# Patient Record
Sex: Male | Born: 1955 | Race: White | Hispanic: No | Marital: Married | Smoking: Never smoker
Health system: Southern US, Community
[De-identification: ages and names within clinical notes are randomized; demographics above are authoritative.]

## PROBLEM LIST (undated history)

## (undated) DIAGNOSIS — R42 Dizziness and giddiness: Secondary | ICD-10-CM

## (undated) DIAGNOSIS — E785 Hyperlipidemia, unspecified: Secondary | ICD-10-CM

## (undated) DIAGNOSIS — R739 Hyperglycemia, unspecified: Secondary | ICD-10-CM

## (undated) DIAGNOSIS — K649 Unspecified hemorrhoids: Secondary | ICD-10-CM

## (undated) DIAGNOSIS — J329 Chronic sinusitis, unspecified: Secondary | ICD-10-CM

## (undated) DIAGNOSIS — F329 Major depressive disorder, single episode, unspecified: Secondary | ICD-10-CM

## (undated) DIAGNOSIS — S9306XA Dislocation of unspecified ankle joint, initial encounter: Secondary | ICD-10-CM

## (undated) DIAGNOSIS — F32A Depression, unspecified: Secondary | ICD-10-CM

## (undated) HISTORY — DX: Major depressive disorder, single episode, unspecified: F32.9

## (undated) HISTORY — DX: Hyperlipidemia, unspecified: E78.5

## (undated) HISTORY — DX: Chronic sinusitis, unspecified: J32.9

## (undated) HISTORY — DX: Unspecified hemorrhoids: K64.9

## (undated) HISTORY — DX: Depression, unspecified: F32.A

## (undated) HISTORY — DX: Dislocation of unspecified ankle joint, initial encounter: S93.06XA

## (undated) HISTORY — DX: Dizziness and giddiness: R42

## (undated) HISTORY — PX: HEMORRHOIDECTOMY WITH HEMORRHOID BANDING: SHX5633

## (undated) HISTORY — DX: Hyperglycemia, unspecified: R73.9

---

## 1982-02-08 HISTORY — PX: VASECTOMY: SHX75

## 2004-02-09 DIAGNOSIS — S9306XA Dislocation of unspecified ankle joint, initial encounter: Secondary | ICD-10-CM

## 2004-02-09 HISTORY — PX: OTHER SURGICAL HISTORY: SHX169

## 2004-02-09 HISTORY — DX: Dislocation of unspecified ankle joint, initial encounter: S93.06XA

## 2008-05-16 ENCOUNTER — Encounter: Admission: RE | Admit: 2008-05-16 | Discharge: 2008-05-16 | Payer: Self-pay | Admitting: Family Medicine

## 2008-07-01 HISTORY — PX: OTHER SURGICAL HISTORY: SHX169

## 2014-08-16 DIAGNOSIS — R079 Chest pain, unspecified: Secondary | ICD-10-CM | POA: Insufficient documentation

## 2014-08-16 DIAGNOSIS — Z8659 Personal history of other mental and behavioral disorders: Secondary | ICD-10-CM

## 2014-08-16 DIAGNOSIS — M25561 Pain in right knee: Secondary | ICD-10-CM

## 2014-08-16 DIAGNOSIS — R040 Epistaxis: Secondary | ICD-10-CM

## 2014-08-16 DIAGNOSIS — R030 Elevated blood-pressure reading, without diagnosis of hypertension: Secondary | ICD-10-CM

## 2014-08-16 DIAGNOSIS — Z125 Encounter for screening for malignant neoplasm of prostate: Secondary | ICD-10-CM

## 2014-08-16 DIAGNOSIS — Z113 Encounter for screening for infections with a predominantly sexual mode of transmission: Secondary | ICD-10-CM

## 2014-08-16 DIAGNOSIS — H6980 Other specified disorders of Eustachian tube, unspecified ear: Secondary | ICD-10-CM | POA: Insufficient documentation

## 2014-08-16 DIAGNOSIS — D497 Neoplasm of unspecified behavior of endocrine glands and other parts of nervous system: Secondary | ICD-10-CM

## 2014-08-16 DIAGNOSIS — J329 Chronic sinusitis, unspecified: Secondary | ICD-10-CM

## 2014-08-16 DIAGNOSIS — H6990 Unspecified Eustachian tube disorder, unspecified ear: Secondary | ICD-10-CM

## 2014-08-16 DIAGNOSIS — K921 Melena: Secondary | ICD-10-CM

## 2014-08-16 DIAGNOSIS — R740 Nonspecific elevation of levels of transaminase and lactic acid dehydrogenase [LDH]: Secondary | ICD-10-CM

## 2014-08-16 DIAGNOSIS — R7401 Elevation of levels of liver transaminase levels: Secondary | ICD-10-CM | POA: Insufficient documentation

## 2014-08-16 DIAGNOSIS — R361 Hematospermia: Secondary | ICD-10-CM

## 2014-08-16 DIAGNOSIS — R7402 Elevation of levels of lactic acid dehydrogenase (LDH): Secondary | ICD-10-CM

## 2014-08-16 DIAGNOSIS — A5139 Other secondary syphilis of skin: Secondary | ICD-10-CM

## 2014-08-16 DIAGNOSIS — R739 Hyperglycemia, unspecified: Secondary | ICD-10-CM

## 2014-08-19 ENCOUNTER — Encounter: Payer: Self-pay | Admitting: Nurse Practitioner

## 2014-08-19 ENCOUNTER — Ambulatory Visit (INDEPENDENT_AMBULATORY_CARE_PROVIDER_SITE_OTHER): Payer: BLUE CROSS/BLUE SHIELD | Admitting: Nurse Practitioner

## 2014-08-19 VITALS — BP 142/82 | HR 75 | Ht 72.0 in | Wt 209.2 lb

## 2014-08-19 DIAGNOSIS — R079 Chest pain, unspecified: Secondary | ICD-10-CM

## 2014-08-19 DIAGNOSIS — R0602 Shortness of breath: Secondary | ICD-10-CM

## 2014-08-19 DIAGNOSIS — E785 Hyperlipidemia, unspecified: Secondary | ICD-10-CM

## 2014-08-19 MED ORDER — ASPIRIN EC 81 MG PO TBEC: 81.0000 mg | DELAYED_RELEASE_TABLET | Freq: Every day | ORAL | Status: DC

## 2014-08-19 NOTE — Patient Instructions (Addendum)
We will be checking the following labs today - NONE   Medication Instructions:    Continue with your current medicines  Add aspirin 81 mg a day    Testing/Procedures To Be Arranged:  Stress echocardiogram  Exercise Stress Echocardiogram An exercise stress echocardiogram is a heart (cardiac) test used to check the function of your heart. This test may also be called an exercise stress echocardiography or stress echo. This stress test will check how well your heart muscle and valves are working and determine if your heart muscle is getting enough blood. You will exercise on a treadmill to naturally increase or stress the functioning of your heart.  An echocardiogram uses sound waves (ultrasound) to produce an image of your heart. If your heart does not work normally, it may indicate coronary artery disease with poor coronary blood supply. The coronary arteries are the arteries that bring blood and oxygen to your heart. LET [facility] CARE PROVIDER KNOW ABOUT:  Any allergies you have.  All medicines you are taking, including vitamins, herbs, eye drops, creams, and over-the-counter medicines.  Previous problems you or members of your family have had with the use of anesthetics.  Any blood disorders you have.  Previous surgeries you have had.  Medical conditions you have.  Possibility of pregnancy, if this applies. RISKS AND COMPLICATIONS Generally, this is a safe procedure. However, as with any procedure, complications can occur. Possible complications can include:  You develop pain or pressure in the following areas:  Chest.  Jaw or neck.  Between your shoulder blades.  Radiating down your left arm.  Dizziness or lightheadedness.  Shortness of breath.  Increased or irregular heartbeat.  Nausea or vomiting.  Heart attack (rare). BEFORE THE PROCEDURE  Avoid all forms of caffeine for 24 hours before your test or as directed by your health care provider. This  includes coffee, tea (even decaffeinated tea), caffeinated sodas, chocolate, cocoa, and certain pain medicines.  Follow your health care provider's instructions regarding eating and drinking before the test.  Take your medicines as directed at regular times with water unless instructed otherwise. Exceptions may include:  If you have diabetes, ask how you are to take your insulin or pills. It is common to adjust insulin dosing the morning of the test.  If you are taking beta-blocker medicines, it is important to talk to your health care provider about these medicines well before the date of your test. Taking beta-blocker medicines may interfere with the test. In some cases, these medicines need to be changed or stopped 24 hours or more before the test.  If you wear a nitroglycerin patch, it may need to be removed prior to the test. Ask your health care provider if the patch should be removed before the test.  If you use an inhaler for any breathing condition, bring it with you to the test.  If you are an outpatient, bring a snack so you can eat right after the stress phase of the test.  Do not smoke for 4 hours prior to the test or as directed by your health care provider.  Wear loose-fitting clothes and comfortable shoes for the test. This test involves walking on a treadmill. PROCEDURE   Multiple electrodes will be put on your chest. If needed, small areas of your chest may be shaved to get better contact with the electrodes. Once the electrodes are attached to your body, multiple wires will be attached to the electrodes, and your heart rate will be  monitored.  You will have an echocardiogram done at rest.  To produce this image of your heart, gel is applied to your chest, and a wand-like tool (transducer) is moved over the chest. The transducer sends the sound waves through the chest to create the moving images of your heart.  You may need an IV to receive a medication that improves the  quality of the pictures.  You will then walk on a treadmill. The treadmill will be started at a slow pace. The treadmill speed and incline will gradually be increased to raise your heart rate.  At the peak of exercise, the treadmill will be stopped. You will lie down immediately on a bed so that a second echocardiogram can be done to visualize your heart's motion with exercise.  The test usually takes 30-60 minutes to complete. AFTER THE PROCEDURE  Your heart rate and blood pressure will be monitored after the test.  You may return to your normal schedule, including diet, activities, and medicines, unless your health care provider tells you otherwise.   Follow-Up:   Will see back if stress test is abnormal or if has further symptoms    Other Special Instructions:   N/A  Call the [place] office at [phone] if you have any questions, problems or concerns.

## 2014-08-19 NOTE — Progress Notes (Signed)
CARDIOLOGY OFFICE NOTE  Date:  08/19/2014    Jason Crane Date of Birth: 1955-02-11 Medical Record #[id number]  PCP:  No primary care provider on file.  Cardiologist:  New  - discussed with Dr. Candee Crane  Chief Complaint  Patient presents with  . Chest Pain    New patient visit.     History of Present Illness: Jason Crane is a 59 y.o. male who presents today for a new patient visit. He has never been seen by cardiology. He is the father of Jason Handler, PA with TCTS. He has no known CAD but has HLD - on Zocor, sinusitis, prior elevated BP, hyperglycemia, & depression. Reports a negative stress test when he was 41 while in [place]. He is a nonsmoker. FH + for early CAD with his father having 1st MI at age 20 - smoker, diabetic and he passed at age 63. 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   He was last seen by his PCP in December - notes reviewed.   Comes in today. Here alone. Here mostly at the urging of Jason Crane. He notes that he has an easier tendency to sweat. More easily dehydrated. BP has been borderline along with his glucose - he has been actively losing weight. Walks several times a week and golfs. No chest pain. Not short of breath. No indigestion, tightness, heaviness. 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 No frank syncope. Does not take aspirin regularly.   Past Medical History  Diagnosis Date  . HLD (hyperlipidemia)   . Sinusitis   . Depression   . Hemorrhoid   . Ankle dislocation 2006  . Hyperglycemia   . Vertigo     Past Surgical History  Procedure Laterality Date  . Colonscopy  5.24.2010  . Hemorrhoidectomy with hemorrhoid banding    . Vasectomy  1984  . Left ankle surgery  2006     Medications: Current Outpatient Prescriptions  Medication Sig Dispense Refill  . Multiple Vitamin (MULTIVITAMIN) capsule Take 1 capsule by mouth daily.    . simvastatin (ZOCOR) 40 MG tablet Take 40 mg by mouth daily.    Marland Kitchen aspirin EC 81 MG tablet Take 1  tablet (81 mg total) by mouth daily. 90 tablet 3  . fluticasone (VERAMYST) 27.5 MCG/SPRAY nasal spray Place 2 sprays into the nose daily.     No current facility-administered medications for this visit.    Allergies: No Known Allergies  Social History: The patient  reports that he has never smoked. He does not have any smokeless tobacco history on file. He reports that he drinks alcohol. He reports that he does not use illicit drugs.   Family History: The patient's family history includes Alzheimer's disease in his maternal grandmother; Diabetes in his father and paternal grandmother; Heart disease (age of onset: 51) in his father; Hypertension in his father; Pancreatic cancer (age of onset: 53) in his mother.   Review of Systems: Please see the history of present illness.   Otherwise, the review of systems is positive for none.   All other systems are reviewed and negative.   Physical Exam: VS:  BP 142/82 mmHg  Pulse 75  Ht 6' (1.829 m)  Wt 209 lb 3.2 oz (94.892 kg)  BMI 28.37 kg/m2  SpO2 97% .  BMI Body mass index is 28.37 kg/(m^2).  Wt Readings from Last 3 Encounters:  08/19/14 209 lb 3.2 oz (94.892 kg)   BP by me is down to 120/85.  General: Pleasant. Well developed, well nourished and in no acute distress.  HEENT: Normal. Neck: Supple, no JVD, carotid bruits, or masses noted.  Cardiac: Regular rate and rhythm. No murmurs, rubs, or gallops. No edema.  Respiratory:  Lungs are clear to auscultation bilaterally with normal work of breathing.  GI: Soft and nontender.  MS: No deformity or atrophy. Gait and ROM intact. Skin: Warm and dry. Color is normal.  Neuro:  Strength and sensation are intact and no gross focal deficits noted.  Psych: Alert, appropriate and with normal affect.   LABORATORY DATA:  EKG:  EKG is ordered today. This demonstrates NSR. He has inferior Q's. He has poor R wave progression - EKG reviewed with Dr. Marlou Crane here in the office.  No results found  for: WBC, HGB, HCT, PLT, GLUCOSE, CHOL, TRIG, HDL, LDLDIRECT, LDLCALC, ALT, AST, NA, K, CL, CREATININE, BUN, CO2, TSH, PSA, INR, GLUF, HGBA1C, MICROALBUR  BNP (last 3 results) No results for input(s): BNP in the last 8760 hours.  ProBNP (last 3 results) No results for input(s): PROBNP in the last 8760 hours.   Other Studies Reviewed Today: Outside records.   Assessment/Plan: 1. Abnormal EKG - no real symptoms reported. EKG from 2011 is abnormal as well.  Negative chest pain, shortness of breath, syncope. Exam is ok. Discussed with Dr. Marlou Crane here in the office - will arrange for stress echocardiogram. Recommending aspirin 81 mg daily as well. Further disposition to follow.   2. HLD - on statin - his last panel showed a TC of 154, TG 105, HLD 36 and LDL 97  3. Borderline HTN - recheck by me is ok. He is actively working on his weight/diet. Would continue to follow.  4. Postural dizziness - without frank syncope.   5. Easily diaphoretic - not sure what to make of that. No other associated symptoms.   Current medicines are reviewed with the patient today.  The patient does not have concerns regarding medicines other than what has been noted above.  The following changes have been made:  See above.  Labs/ tests ordered today include:    Orders Placed This Encounter  Procedures  . EKG 12-Lead  . ECHO STRESS TEST     Disposition:   Further disposition to follow.   Patient is agreeable to this plan and will call if any problems develop in the interim.   Signed: Burtis Junes, RN, ANP-C 08/19/2014 1:59 PM  [facility] [address] [place], [place]  [address] Phone: [phone] Fax: [phone]

## 2014-08-21 ENCOUNTER — Telehealth (HOSPITAL_COMMUNITY): Payer: Self-pay | Admitting: *Deleted

## 2014-08-21 NOTE — Telephone Encounter (Signed)
Left message on voicemail in reference to upcoming appointment scheduled for 08/26/14. Phone number given for a call back so details instructions can be given. Hubbard Robinson, RN

## 2014-08-22 ENCOUNTER — Telehealth (HOSPITAL_COMMUNITY): Payer: Self-pay | Admitting: *Deleted

## 2014-08-22 NOTE — Telephone Encounter (Signed)
Left message on voicemail in reference to upcoming appointment scheduled for 08/23/14. Phone number given for a call back so details instructions can be given. Consuello Lassalle J Auburn Hester, RN 

## 2014-08-26 ENCOUNTER — Ambulatory Visit (HOSPITAL_COMMUNITY): Payer: BLUE CROSS/BLUE SHIELD | Attending: Cardiology

## 2014-08-26 ENCOUNTER — Ambulatory Visit (HOSPITAL_BASED_OUTPATIENT_CLINIC_OR_DEPARTMENT_OTHER): Payer: BLUE CROSS/BLUE SHIELD

## 2014-08-26 DIAGNOSIS — R0602 Shortness of breath: Secondary | ICD-10-CM

## 2014-08-26 DIAGNOSIS — R079 Chest pain, unspecified: Secondary | ICD-10-CM | POA: Diagnosis not present

## 2014-08-26 DIAGNOSIS — E785 Hyperlipidemia, unspecified: Secondary | ICD-10-CM

## 2014-08-26 DIAGNOSIS — Z8249 Family history of ischemic heart disease and other diseases of the circulatory system: Secondary | ICD-10-CM | POA: Insufficient documentation

## 2014-11-21 ENCOUNTER — Encounter: Payer: Self-pay | Admitting: Gastroenterology

## 2015-01-21 ENCOUNTER — Ambulatory Visit: Payer: BLUE CROSS/BLUE SHIELD | Admitting: Gastroenterology

## 2015-03-22 ENCOUNTER — Emergency Department (HOSPITAL_COMMUNITY)
Admission: EM | Admit: 2015-03-22 | Discharge: 2015-03-22 | Disposition: A | Discharge status: Home / Self Care | Payer: BLUE CROSS/BLUE SHIELD | Attending: Emergency Medicine | Admitting: Emergency Medicine

## 2015-03-22 ENCOUNTER — Emergency Department (HOSPITAL_COMMUNITY): Payer: BLUE CROSS/BLUE SHIELD

## 2015-03-22 ENCOUNTER — Encounter (HOSPITAL_COMMUNITY): Payer: Self-pay | Admitting: Emergency Medicine

## 2015-03-22 ENCOUNTER — Emergency Department
Admission: EM | Admit: 2015-03-22 | Discharge: 2015-03-22 | Disposition: A | Discharge status: Home / Self Care | Payer: BLUE CROSS/BLUE SHIELD | Attending: Emergency Medicine | Admitting: Emergency Medicine

## 2015-03-22 ENCOUNTER — Encounter: Payer: Self-pay | Admitting: Emergency Medicine

## 2015-03-22 DIAGNOSIS — S61411A Laceration without foreign body of right hand, initial encounter: Secondary | ICD-10-CM | POA: Insufficient documentation

## 2015-03-22 DIAGNOSIS — Y9389 Activity, other specified: Secondary | ICD-10-CM | POA: Diagnosis not present

## 2015-03-22 DIAGNOSIS — W312XXA Contact with powered woodworking and forming machines, initial encounter: Secondary | ICD-10-CM | POA: Insufficient documentation

## 2015-03-22 DIAGNOSIS — S61412A Laceration without foreign body of left hand, initial encounter: Secondary | ICD-10-CM | POA: Diagnosis not present

## 2015-03-22 DIAGNOSIS — Z7951 Long term (current) use of inhaled steroids: Secondary | ICD-10-CM | POA: Insufficient documentation

## 2015-03-22 DIAGNOSIS — Z7982 Long term (current) use of aspirin: Secondary | ICD-10-CM | POA: Insufficient documentation

## 2015-03-22 DIAGNOSIS — E785 Hyperlipidemia, unspecified: Secondary | ICD-10-CM | POA: Insufficient documentation

## 2015-03-22 DIAGNOSIS — Y998 Other external cause status: Secondary | ICD-10-CM | POA: Diagnosis not present

## 2015-03-22 DIAGNOSIS — Y9289 Other specified places as the place of occurrence of the external cause: Secondary | ICD-10-CM | POA: Insufficient documentation

## 2015-03-22 DIAGNOSIS — Z79899 Other long term (current) drug therapy: Secondary | ICD-10-CM | POA: Diagnosis not present

## 2015-03-22 DIAGNOSIS — F329 Major depressive disorder, single episode, unspecified: Secondary | ICD-10-CM | POA: Insufficient documentation

## 2015-03-22 MED ORDER — CEPHALEXIN 500 MG PO CAPS: 500.0000 mg | ORAL_CAPSULE | Freq: Four times a day (QID) | ORAL | Status: DC

## 2015-03-22 NOTE — ED Notes (Signed)
Pt to nurse's station upset with wait time. Explained to patient that our providers are seeing multiple patients at this time. Pt stated "this is ridiculous, this is not a scratch" pt states he going to [facility]. Also made the comment that he better not get billed for this.
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
## 2015-03-22 NOTE — ED Notes (Signed)
The pt has minimal bleeding and his hand is not hurting at present
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
## 2015-03-22 NOTE — Discharge Instructions (Signed)
Mr. Jason Crane,  Nice meeting you! Please follow-up with hand. Return to the emergency department if you develop increased pain, yellow/green drainage, red streaking from the wound. Feel better soon!  S. Wendie Simmer, PA-C

## 2015-03-22 NOTE — ED Notes (Addendum)
Pt presents with deep laceration to palm of right hand. Bleeding controlled with pressure. Bleeding resumes when dressing applied. Pt has movement of fingers. Pt states has feeling in all fingers, sensation in right fifth finger.

## 2015-03-22 NOTE — ED Notes (Signed)
Cut hand with power saw just prior to arrival, cut to outer L hand with bleeding controlled with bandage.

## 2015-03-22 NOTE — ED Notes (Signed)
Patient her with laceration to significant left hand laceration. Was operating a circular saw tonight without the safety guard. Saw caught the outer edge of left hand. Bleeding currently controlled with dressing. Injury is isolated to palm. All finger motion intact, no paresthesia.

## 2017-04-27 ENCOUNTER — Encounter (HOSPITAL_COMMUNITY): Payer: Self-pay

## 2017-04-27 ENCOUNTER — Emergency Department (HOSPITAL_COMMUNITY)
Admission: EM | Admit: 2017-04-27 | Discharge: 2017-04-27 | Disposition: A | Discharge status: Home / Self Care | Payer: BLUE CROSS/BLUE SHIELD | Attending: Emergency Medicine | Admitting: Emergency Medicine

## 2017-04-27 ENCOUNTER — Emergency Department (HOSPITAL_COMMUNITY): Payer: BLUE CROSS/BLUE SHIELD

## 2017-04-27 ENCOUNTER — Other Ambulatory Visit: Payer: Self-pay

## 2017-04-27 DIAGNOSIS — M545 Low back pain: Secondary | ICD-10-CM | POA: Insufficient documentation

## 2017-04-27 DIAGNOSIS — Z79899 Other long term (current) drug therapy: Secondary | ICD-10-CM | POA: Insufficient documentation

## 2017-04-27 DIAGNOSIS — Z8639 Personal history of other endocrine, nutritional and metabolic disease: Secondary | ICD-10-CM | POA: Diagnosis not present

## 2017-04-27 DIAGNOSIS — Z7982 Long term (current) use of aspirin: Secondary | ICD-10-CM | POA: Diagnosis not present

## 2017-04-27 DIAGNOSIS — N200 Calculus of kidney: Secondary | ICD-10-CM

## 2017-04-27 DIAGNOSIS — R109 Unspecified abdominal pain: Secondary | ICD-10-CM | POA: Diagnosis present

## 2017-04-27 LAB — URINALYSIS, ROUTINE W REFLEX MICROSCOPIC
Bacteria, UA: NONE SEEN
Bilirubin Urine: NEGATIVE
Glucose, UA: NEGATIVE mg/dL
Ketones, ur: NEGATIVE mg/dL
Leukocytes, UA: NEGATIVE
Nitrite: NEGATIVE
Protein, ur: NEGATIVE mg/dL
Specific Gravity, Urine: 1.016 (ref 1.005–1.030)
Squamous Epithelial / LPF: NONE SEEN
pH: 5 (ref 5.0–8.0)

## 2017-04-27 LAB — CBC WITH DIFFERENTIAL/PLATELET
Basophils Absolute: 0 10*3/uL (ref 0.0–0.1)
Basophils Relative: 1 %
Eosinophils Absolute: 0.2 10*3/uL (ref 0.0–0.7)
Eosinophils Relative: 3 %
HCT: 40.9 % (ref 39.0–52.0)
Hemoglobin: 13.6 g/dL (ref 13.0–17.0)
Lymphocytes Relative: 27 %
Lymphs Abs: 1.4 10*3/uL (ref 0.7–4.0)
MCH: 30.2 pg (ref 26.0–34.0)
MCHC: 33.3 g/dL (ref 30.0–36.0)
MCV: 90.7 fL (ref 78.0–100.0)
Monocytes Absolute: 0.4 10*3/uL (ref 0.1–1.0)
Monocytes Relative: 8 %
Neutro Abs: 3.3 10*3/uL (ref 1.7–7.7)
Neutrophils Relative %: 61 %
Platelets: 206 10*3/uL (ref 150–400)
RBC: 4.51 MIL/uL (ref 4.22–5.81)
RDW: 13 % (ref 11.5–15.5)
WBC: 5.4 10*3/uL (ref 4.0–10.5)

## 2017-04-27 LAB — BASIC METABOLIC PANEL
Anion gap: 9 (ref 5–15)
BUN: 15 mg/dL (ref 6–20)
CO2: 25 mmol/L (ref 22–32)
Calcium: 8.5 mg/dL — ABNORMAL LOW (ref 8.9–10.3)
Chloride: 102 mmol/L (ref 101–111)
Creatinine, Ser: 0.91 mg/dL (ref 0.61–1.24)
GFR calc Af Amer: >60 mL/min (ref >60)
GFR calc non Af Amer: >60 mL/min (ref >60)
Glucose, Bld: 146 mg/dL — ABNORMAL HIGH (ref 65–99)
Potassium: 3.8 mmol/L (ref 3.5–5.1)
Sodium: 136 mmol/L (ref 135–145)

## 2017-04-27 MED ORDER — TAMSULOSIN HCL 0.4 MG PO CAPS: 0.4000 mg | ORAL_CAPSULE | Freq: Every day | ORAL | 0 refills | Status: DC

## 2017-04-27 MED ORDER — KETOROLAC TROMETHAMINE 10 MG PO TABS: 10.0000 mg | ORAL_TABLET | Freq: Four times a day (QID) | ORAL | 0 refills | Status: DC | PRN

## 2017-04-27 MED ORDER — OXYCODONE-ACETAMINOPHEN 5-325 MG PO TABS
1.0000 | ORAL_TABLET | ORAL | Status: DC | PRN
  Administered 2017-04-27: 1 via ORAL
  Filled 2017-04-27 (×2): qty 1

## 2017-04-27 MED ORDER — OXYCODONE-ACETAMINOPHEN 5-325 MG PO TABS: 1.0000 | ORAL_TABLET | Freq: Four times a day (QID) | ORAL | 0 refills | Status: DC | PRN

## 2017-04-27 MED ORDER — HYDROMORPHONE HCL 1 MG/ML IJ SOLN
0.5000 mg | Freq: Once | INTRAMUSCULAR | Status: AC
Start: 2017-04-27 — End: 2017-04-27
  Administered 2017-04-27: 0.5 mg via INTRAVENOUS
  Filled 2017-04-27: qty 1

## 2017-04-27 MED ORDER — KETOROLAC TROMETHAMINE 30 MG/ML IJ SOLN
30.0000 mg | Freq: Once | INTRAMUSCULAR | Status: AC
  Administered 2017-04-27: 30 mg via INTRAMUSCULAR
  Filled 2017-04-27: qty 1

## 2017-04-27 NOTE — ED Notes (Signed)
Declined W/C at D/C and was escorted to lobby by RN. 

## 2017-04-27 NOTE — ED Triage Notes (Signed)
Pt presents today with complaints of sudden flank pain, denies urinary symptoms.

## 2017-04-27 NOTE — Discharge Instructions (Signed)
Please read attached information. If you experience any new or worsening signs or symptoms please return to the emergency room for evaluation. Please follow-up with your primary care provider or specialist as discussed. Please use medication prescribed only as directed and discontinue taking if you have any concerning signs or symptoms.   °

## 2017-04-27 NOTE — ED Provider Notes (Addendum)
[facility] EMERGENCY DEPARTMENT Provider Note   CSN: [id number] Arrival date & time: 04/27/17  1315     History   Chief Complaint Chief Complaint  Patient presents with  . Flank Pain    HPI Jason Crane is a 62 y.o. male.  HPI   62 year old male presents today with complaints of acute onset right flank pain patient notes that around 1130 he developed pain in the right flank that progressively worsened, severe in nature, radiating around to the front.  He denies any nausea or vomiting, denies any shortness of breath.  Patient denies any preceding urinary symptoms.  No history of the same, no trauma.  History of kidney stones.   Past Medical History:  Diagnosis Date  . Ankle dislocation 2006  . Depression   . Hemorrhoid   . HLD (hyperlipidemia)   . Hyperglycemia   . Sinusitis   . Vertigo     Patient Active Problem List   Diagnosis Date Noted  . Hyperglycemia 08/16/2014  . Sinusitis 08/16/2014  . Elevated blood pressure reading without diagnosis of hypertension 08/16/2014  . Special screening for malignant neoplasm of prostate 08/16/2014  . Epistaxis, recurrent 08/16/2014  . Hematospermia 08/16/2014  . 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 Right knee pain 08/16/2014  . Blood in stool 08/16/2014  . History of depression 08/16/2014  . Nonspecific elevation of levels of transaminase or lactic acid dehydrogenase (LDH) 08/16/2014  . Screening for STDs (sexually transmitted diseases) 08/16/2014  . 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 Dysfunction of eustachian tube 08/16/2014  . Chest pain 08/16/2014    Past Surgical History:  Procedure Laterality Date  . colonscopy  5.24.2010  . HEMORRHOIDECTOMY WITH HEMORRHOID BANDING    . left ankle surgery  2006  . VASECTOMY  1984       Home Medications    Prior to Admission medications   Medication Sig Start Date End Date Taking? Authorizing Provider  aspirin EC 81 MG tablet Take 1 tablet (81  mg total) by mouth daily. 08/19/14   Burtis Junes, NP  cephALEXin (KEFLEX) 500 MG capsule Take 1 capsule (500 mg total) by mouth 4 (four) times daily. 03/22/15   Plains Lions, PA-C  fluticasone (VERAMYST) 27.5 MCG/SPRAY nasal spray Place 2 sprays into the nose daily.    [provider]  ketorolac (TORADOL) 10 MG tablet Take 1 tablet (10 mg total) by mouth every 6 (six) hours as needed. 04/27/17   Gatsby Chismar, Dellis Filbert, PA-C  Multiple Vitamin (MULTIVITAMIN) capsule Take 1 capsule by mouth daily.    [provider]  oxyCODONE-acetaminophen (PERCOCET) 5-325 MG tablet Take 1 tablet by mouth every 6 (six) hours as needed for severe pain. 04/27/17   Kelci Petrella, Dellis Filbert, PA-C  simvastatin (ZOCOR) 40 MG tablet Take 40 mg by mouth daily.    [provider]  tamsulosin (FLOMAX) 0.4 MG CAPS capsule Take 1 capsule (0.4 mg total) by mouth daily. 04/27/17   Okey Regal, PA-C    Family History Family History  Problem Relation Age of Onset  . Hypertension Father   . Diabetes Father   . Heart disease Father 63       1st MI - died at age 22  . Pancreatic cancer Mother 71  . Alzheimer's disease Maternal Grandmother   . Diabetes Paternal Grandmother     Social History Social History   Tobacco Use  . Smoking status: Never Smoker  Substance Use Topics  . Alcohol use: Yes  Comment: 3 per month  . Drug use: No     Allergies   Patient has no known allergies.   Review of Systems Review of Systems  All other systems reviewed and are negative.   Physical Exam Updated Vital Signs BP (!) 162/99   Pulse 89   Temp 97.9 F (36.6 C) (Oral)   Resp 16   Wt 92.1 kg (203 lb)   SpO2 100%   BMI 27.53 kg/m   Physical Exam  Constitutional: He is oriented to person, place, and time. He appears well-developed and well-nourished.  HENT:  Head: Normocephalic and atraumatic.  Eyes: Conjunctivae are normal. Pupils are equal, round, and reactive to light. Right eye exhibits  no discharge. Left eye exhibits no discharge. No scleral icterus.  Neck: Normal range of motion. No JVD present. No tracheal deviation present.  Pulmonary/Chest: Effort normal. No stridor.  Abdominal: Soft. He exhibits no distension and no mass. There is no tenderness. There is no rebound and no guarding. No hernia.  No CVA tenderness  Neurological: He is alert and oriented to person, place, and time. Coordination normal.  Psychiatric: He has a normal mood and affect. His behavior is normal. Judgment and thought content normal.  Nursing note and vitals reviewed.    ED Treatments / Results  Labs (all labs ordered are listed, but only abnormal results are displayed) Labs Reviewed  BASIC METABOLIC PANEL - Abnormal; Notable for the following components:      Result Value   Glucose, Bld 146 (*)    Calcium 8.5 (*)    All other components within normal limits  URINALYSIS, ROUTINE W REFLEX MICROSCOPIC - Abnormal; Notable for the following components:   Hgb urine dipstick LARGE (*)    All other components within normal limits  CBC WITH DIFFERENTIAL/PLATELET    EKG  EKG Interpretation None       Radiology Ct Renal Stone Study  Result Date: 04/27/2017 CLINICAL DATA:  Right flank pain, back pain EXAM: CT ABDOMEN AND PELVIS WITHOUT CONTRAST TECHNIQUE: Multidetector CT imaging of the abdomen and pelvis was performed following the standard protocol without IV contrast. COMPARISON:  None. FINDINGS: Lower chest: No acute abnormality. Hepatobiliary: No focal liver abnormality is seen. No gallstones, gallbladder wall thickening, or biliary dilatation. Pancreas: Unremarkable. No pancreatic ductal dilatation or surrounding inflammatory changes. Spleen: Normal in size without focal abnormality. Adrenals/Urinary Tract: Normal right adrenal gland. 10 mm left adrenal nodule measuring -9 Hounsfield units most consistent with a small adrenal adenoma. 2 mm distal right ureteral calculus just proximal to the  UVJ with minimal hydronephrosis. Punctate right upper pole nonobstructing renal calculus. No other renal, ureteral or bladder calculi. Bladder is unremarkable. Stomach/Bowel: Stomach is within normal limits. Appendix appears normal. No evidence of bowel wall thickening, distention, or inflammatory changes. Vascular/Lymphatic: Normal caliber abdominal aorta with atherosclerosis. No lymphadenopathy. Reproductive: Uterus and bilateral adnexa are unremarkable. Other: No abdominal wall hernia or abnormality. No abdominopelvic ascites. Musculoskeletal: No acute osseous abnormality. No aggressive osseous lesion. Mild osteoarthritis bilateral sacroiliac joints. Degenerative disease disease with disc height loss and facet arthropathy at L4-5. IMPRESSION: 1. 2 mm distal right ureteral calculus just proximal to the UVJ with minimal hydronephrosis. 2.  Aortic Atherosclerosis (ICD10-I70.0). Electronically Signed   By: Kathreen Devoid   On: 04/27/2017 14:38    Procedures Procedures (including critical care time)  Medications Ordered in ED Medications  oxyCODONE-acetaminophen (PERCOCET/ROXICET) 5-325 MG per tablet 1 tablet (1 tablet Oral Given 04/27/17 1329)  ketorolac (TORADOL) 30  MG/ML injection 30 mg (30 mg Intramuscular Given 04/27/17 1328)     Initial Impression / Assessment and Plan / ED Course  I have reviewed the triage vital signs and the nursing notes.  Pertinent labs & imaging results that were available during my care of the patient were reviewed by me and considered in my medical decision making (see chart for details).      Final Clinical Impressions(s) / ED Diagnoses   Final diagnoses:  Nephrolithiasis   Labs: CBC, BMP, urinalysis  Imaging: CT renal  Consults:  Therapeutics: Percocet, Toradol  Discharge Meds: Toradol, Flomax, Percocet  Assessment/Plan: 10-year-old male presents today with ureterolithiasis.  This is a 2 m stone at the UVJ.  High suspicion that this will pass without  complication.  Patient has minimal right-sided hydronephrosis, no change in kidney function, no infection in the urine.  His pain is well controlled.  He will be referred to urology as an outpatient, given strict return precautions, he verbalized understanding and agreement to today's plan had no further questions or concerns.  Results of CT were read to the patient including adrenal nodule   ED Discharge Orders        Ordered    tamsulosin (FLOMAX) 0.4 MG CAPS capsule  Daily     04/27/17 1454    oxyCODONE-acetaminophen (PERCOCET) 5-325 MG tablet  Every 6 hours PRN     04/27/17 1454    ketorolac (TORADOL) 10 MG tablet  Every 6 hours PRN     04/27/17 1454       HedgesDellis Filbert, PA-C 04/27/17 1459    Shonette Rhames, Dellis Filbert, PA-C 04/27/17 1501    Daleen Bo, MD 04/28/17 [phone]

## 2020-03-15 IMAGING — CT CT RENAL STONE PROTOCOL
2 of 3 series · 16 of 46 positions shown, 18 images · non-contrast
Comparison: None.

CLINICAL DATA: Right flank pain, back pain

EXAM:
CT ABDOMEN AND PELVIS WITHOUT CONTRAST
TECHNIQUE: Multidetector CT imaging of the abdomen and pelvis was performed
following the standard protocol without IV contrast.

[Series 3: renal stone 5.0 · axial · 0.72mm/px · z∈[+930,+1410]mm · 13 of 112 slices shown, 15 images]
[im 8/112  soft-tissue]
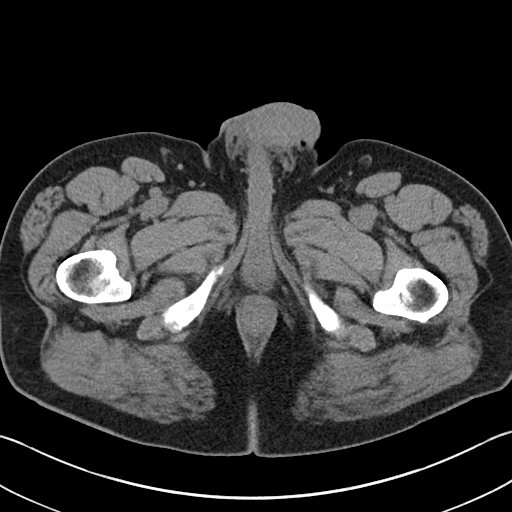
[im 8/112  bone]
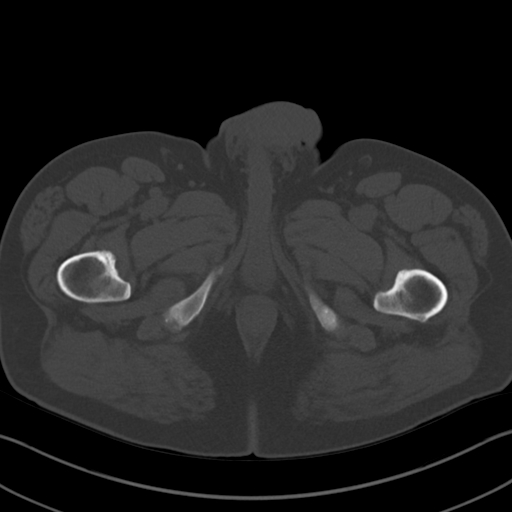
[im 15/112  soft-tissue]
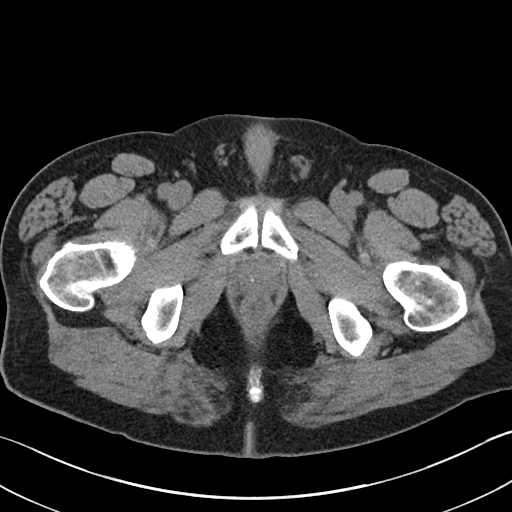
[im 22/112  soft-tissue]
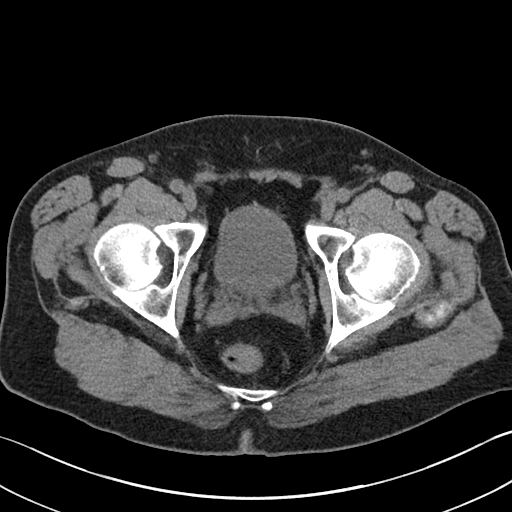
[im 33/112  soft-tissue]
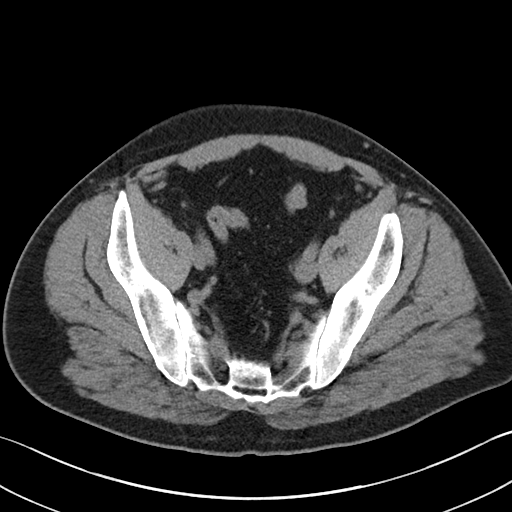
[im 40/112  soft-tissue]
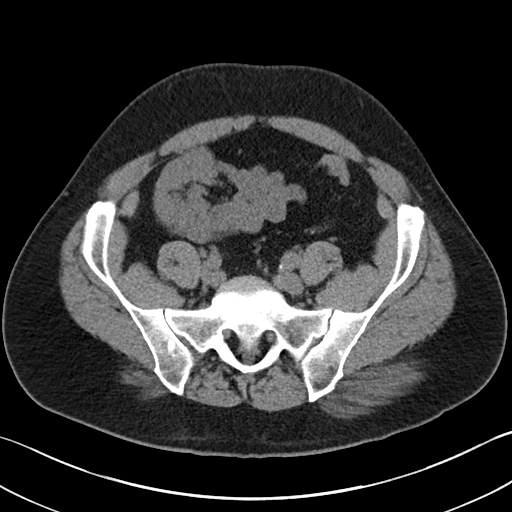
[im 47/112  soft-tissue]
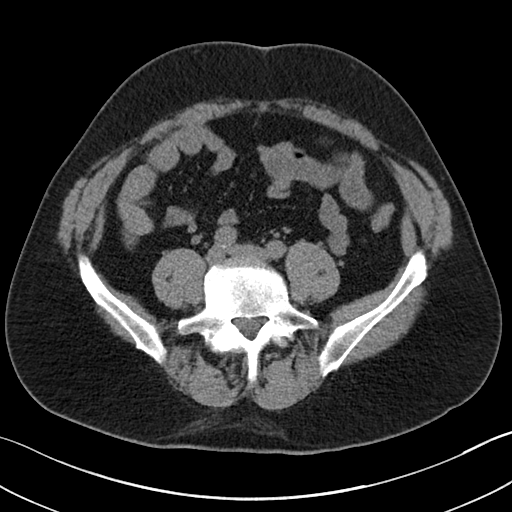
[im 58/112  soft-tissue]
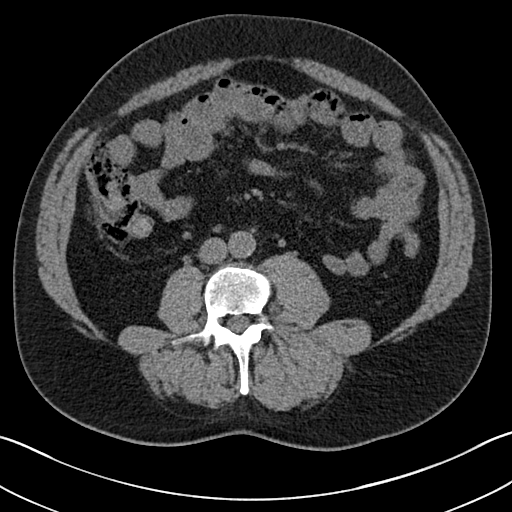
[im 65/112  soft-tissue]
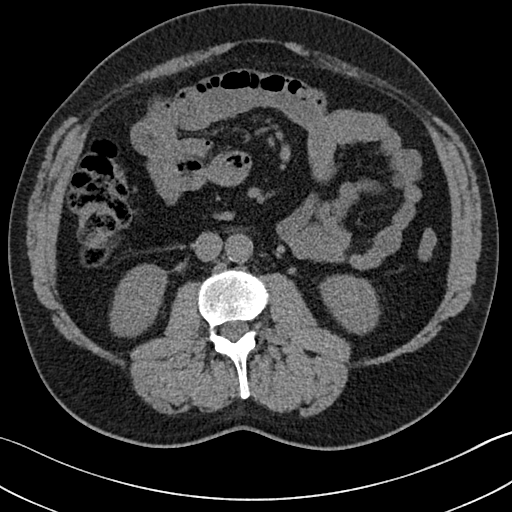
[im 72/112  soft-tissue]
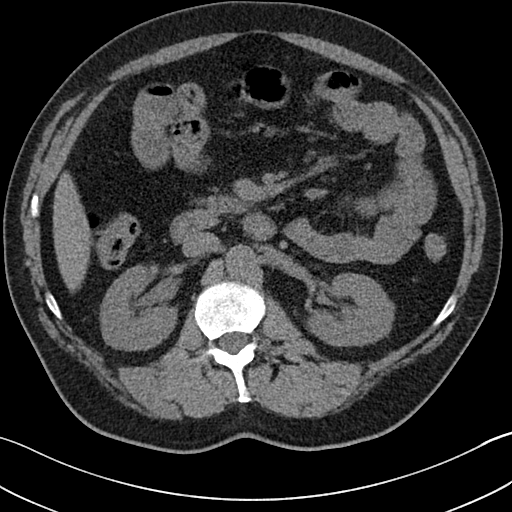
[im 72/112  bone]
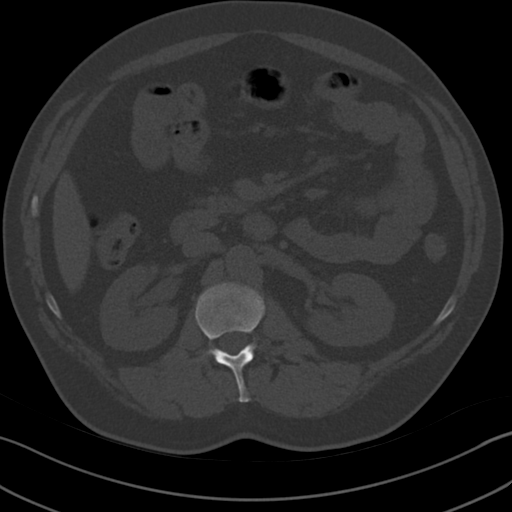
[im 79/112  soft-tissue]
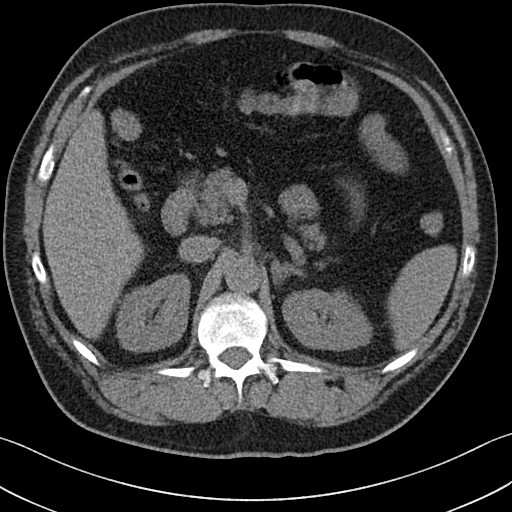
[im 90/112  soft-tissue]
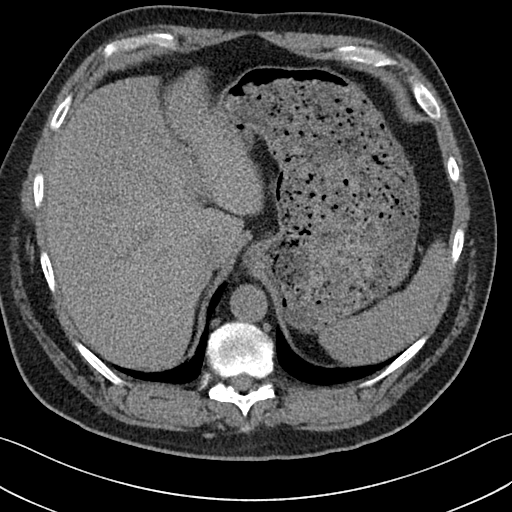
[im 97/112  soft-tissue]
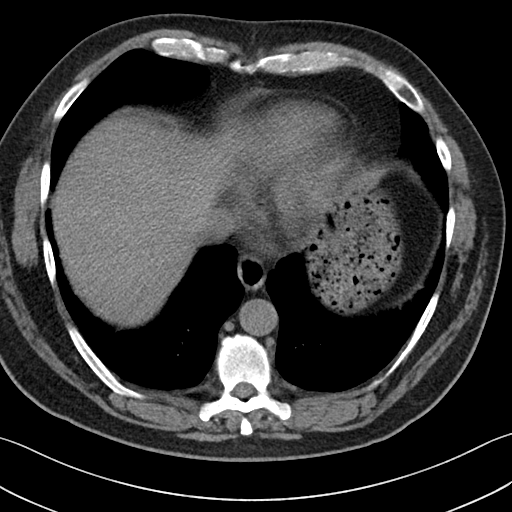
[im 104/112  soft-tissue]
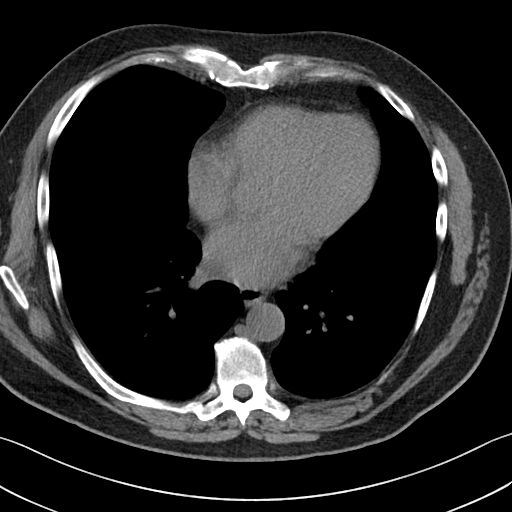

[Series 5: renal stone 3.0 cor · coronal · 0.76mm/px · 3 of 101 slices shown]
[im 34/101  soft-tissue]
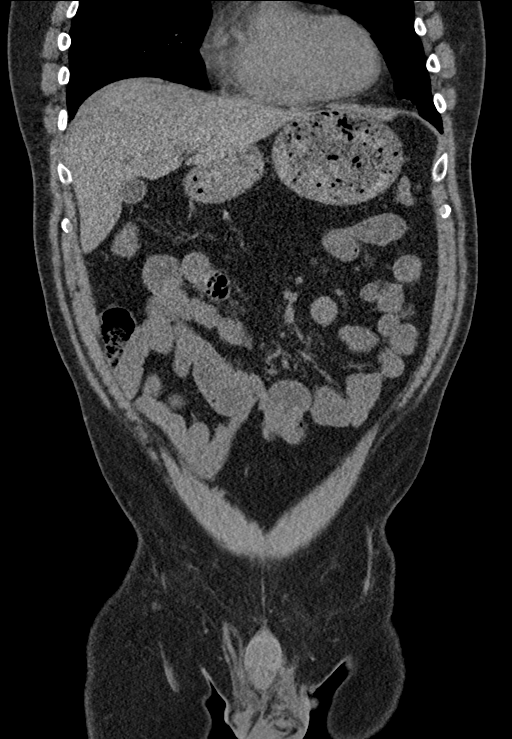
[im 45/101  soft-tissue]
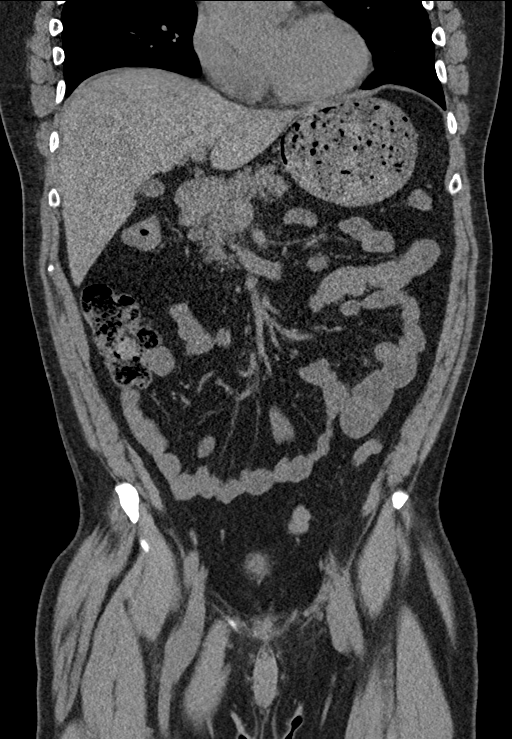
[im 56/101  soft-tissue]
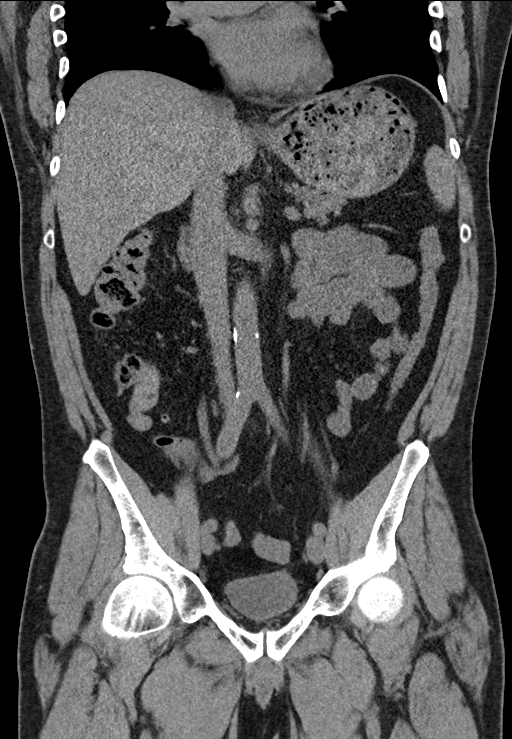

[16 of 46 positions shown; findings below may reference images not displayed]

FINDINGS: Lower chest: No acute abnormality.

Hepatobiliary: No focal liver abnormality is seen. No gallstones,
gallbladder wall thickening, or biliary dilatation.

Pancreas: Unremarkable. No pancreatic ductal dilatation or
surrounding inflammatory changes.

Spleen: Normal in size without focal abnormality.

Adrenals/Urinary Tract: Normal right adrenal gland. 10 mm left
adrenal nodule measuring -9 Hounsfield units most consistent with a
small adrenal adenoma. 2 mm distal right ureteral calculus just
proximal to the UVJ with minimal hydronephrosis. Punctate right
upper pole nonobstructing renal calculus. No other renal, ureteral
or bladder calculi. Bladder is unremarkable.

Stomach/Bowel: Stomach is within normal limits. Appendix appears
normal. No evidence of bowel wall thickening, distention, or
inflammatory changes.

Vascular/Lymphatic: Normal caliber abdominal aorta with
atherosclerosis. No lymphadenopathy.

Reproductive: Uterus and bilateral adnexa are unremarkable.

Other: No abdominal wall hernia or abnormality. No abdominopelvic
ascites.

Musculoskeletal: No acute osseous abnormality. No aggressive osseous
lesion. Mild osteoarthritis bilateral sacroiliac joints.
Degenerative disease disease with disc height loss and facet
arthropathy at L4-5.
IMPRESSION: 1. 2 mm distal right ureteral calculus just proximal to the UVJ with
minimal hydronephrosis.
2.  Aortic Atherosclerosis ([id number]).

## 2020-09-02 DIAGNOSIS — R739 Hyperglycemia, unspecified: Secondary | ICD-10-CM | POA: Diagnosis not present

## 2020-09-02 DIAGNOSIS — E291 Testicular hypofunction: Secondary | ICD-10-CM | POA: Diagnosis not present

## 2020-09-09 DIAGNOSIS — U099 Post covid-19 condition, unspecified: Secondary | ICD-10-CM | POA: Diagnosis not present

## 2020-09-09 DIAGNOSIS — R198 Other specified symptoms and signs involving the digestive system and abdomen: Secondary | ICD-10-CM | POA: Diagnosis not present

## 2020-09-09 DIAGNOSIS — R252 Cramp and spasm: Secondary | ICD-10-CM | POA: Diagnosis not present

## 2020-09-09 DIAGNOSIS — R7303 Prediabetes: Secondary | ICD-10-CM | POA: Diagnosis not present

## 2020-09-09 DIAGNOSIS — E291 Testicular hypofunction: Secondary | ICD-10-CM | POA: Diagnosis not present

## 2020-10-08 DIAGNOSIS — D485 Neoplasm of uncertain behavior of skin: Secondary | ICD-10-CM | POA: Diagnosis not present

## 2020-10-08 DIAGNOSIS — L821 Other seborrheic keratosis: Secondary | ICD-10-CM | POA: Diagnosis not present

## 2020-10-08 DIAGNOSIS — D1801 Hemangioma of skin and subcutaneous tissue: Secondary | ICD-10-CM | POA: Diagnosis not present

## 2020-10-08 DIAGNOSIS — L814 Other melanin hyperpigmentation: Secondary | ICD-10-CM | POA: Diagnosis not present

## 2020-10-08 DIAGNOSIS — L819 Disorder of pigmentation, unspecified: Secondary | ICD-10-CM | POA: Diagnosis not present

## 2021-03-11 DIAGNOSIS — R251 Tremor, unspecified: Secondary | ICD-10-CM | POA: Diagnosis not present

## 2021-03-11 DIAGNOSIS — N401 Enlarged prostate with lower urinary tract symptoms: Secondary | ICD-10-CM | POA: Diagnosis not present

## 2021-03-11 DIAGNOSIS — E785 Hyperlipidemia, unspecified: Secondary | ICD-10-CM | POA: Diagnosis not present

## 2021-03-11 DIAGNOSIS — R3916 Straining to void: Secondary | ICD-10-CM | POA: Diagnosis not present

## 2021-03-11 DIAGNOSIS — R739 Hyperglycemia, unspecified: Secondary | ICD-10-CM | POA: Diagnosis not present

## 2021-03-11 DIAGNOSIS — E291 Testicular hypofunction: Secondary | ICD-10-CM | POA: Diagnosis not present

## 2021-03-16 DIAGNOSIS — R3916 Straining to void: Secondary | ICD-10-CM | POA: Diagnosis not present

## 2021-03-16 DIAGNOSIS — Z0000 Encounter for general adult medical examination without abnormal findings: Secondary | ICD-10-CM | POA: Diagnosis not present

## 2021-03-16 DIAGNOSIS — R7303 Prediabetes: Secondary | ICD-10-CM | POA: Diagnosis not present

## 2021-03-16 DIAGNOSIS — R0602 Shortness of breath: Secondary | ICD-10-CM | POA: Diagnosis not present

## 2021-03-16 DIAGNOSIS — E291 Testicular hypofunction: Secondary | ICD-10-CM | POA: Diagnosis not present

## 2021-03-16 DIAGNOSIS — I700 Atherosclerosis of aorta: Secondary | ICD-10-CM | POA: Diagnosis not present

## 2021-03-16 DIAGNOSIS — E663 Overweight: Secondary | ICD-10-CM | POA: Diagnosis not present

## 2021-03-16 DIAGNOSIS — Z6829 Body mass index (BMI) 29.0-29.9, adult: Secondary | ICD-10-CM | POA: Diagnosis not present

## 2021-03-16 DIAGNOSIS — N401 Enlarged prostate with lower urinary tract symptoms: Secondary | ICD-10-CM | POA: Diagnosis not present

## 2021-03-16 DIAGNOSIS — R053 Chronic cough: Secondary | ICD-10-CM | POA: Diagnosis not present

## 2021-03-20 DIAGNOSIS — M8589 Other specified disorders of bone density and structure, multiple sites: Secondary | ICD-10-CM | POA: Diagnosis not present

## 2021-03-20 DIAGNOSIS — M8580 Other specified disorders of bone density and structure, unspecified site: Secondary | ICD-10-CM | POA: Diagnosis not present

## 2021-03-20 DIAGNOSIS — Z13820 Encounter for screening for osteoporosis: Secondary | ICD-10-CM | POA: Diagnosis not present

## 2021-03-20 DIAGNOSIS — M47816 Spondylosis without myelopathy or radiculopathy, lumbar region: Secondary | ICD-10-CM | POA: Diagnosis not present

## 2021-03-25 DIAGNOSIS — R0602 Shortness of breath: Secondary | ICD-10-CM | POA: Diagnosis not present

## 2021-03-25 DIAGNOSIS — M8580 Other specified disorders of bone density and structure, unspecified site: Secondary | ICD-10-CM | POA: Diagnosis not present

## 2021-08-17 NOTE — Progress Notes (Unsigned)
Cardiology Office Note:    Date:  08/20/2021   ID:  Corliss Skains, DOB 15-Mar-1955, MRN [id number]  PCP:  Jefm Petty, MD   [facility] Providers Cardiologist:  Lenna Sciara, MD Referring MD: Jefm Petty, MD   Chief Complaint/Reason for Referral: Cardiomegaly  ASSESSMENT:    1. Cardiomegaly   2. Hyperlipidemia, unspecified hyperlipidemia type   3. Aortic atherosclerosis (HCC)     PLAN:    In order of problems listed above: 1.  Cardiomegaly: He is EKG is abnormal with both anterior and inferior Q waves.  We will obtain an echocardiogram to evaluate further.  I will have the patient follow-up with me in 3 months.  If his echocardiogram is abnormal and will refer him for cardiac catheterization for restratification.  We will also need to adjust his medical therapy for LV dysfunction if this is present. 2.  Hyperlipidemia: Given the fact the patient has aortic atherosclerosis on the CT scan his goal LDL is less than 70.  We will check lipid panel, LFTs, and LP(a). 3.  Aortic atherosclerosis: Start aspirin 81 mg and continue statin 40 mg.      Dispo:  No follow-ups on file.      Medication Adjustments/Labs and Tests Ordered: Current medicines are reviewed at length with the patient today.  Concerns regarding medicines are outlined above.  The following changes have been made:     Labs/tests ordered: Orders Placed This Encounter  Procedures   Lipid panel   Lipoprotein A (LPA)   EKG 12-Lead   ECHOCARDIOGRAM COMPLETE    Medication Changes: Meds ordered this encounter  Medications   aspirin EC 81 MG tablet    Sig: Take 1 tablet (81 mg total) by mouth daily. Swallow whole.    Dispense:  90 tablet    Refill:  3     Current medicines are reviewed at length with the patient today.  The patient does not have concerns regarding medicines.   History of Present Illness:    FOCUSED PROBLEM LIST:   1.  Hyperlipidemia 2.  Aortic atherosclerosis on CT scan  2019  The patient is a 66 y.o. male with the indicated medical history here as a self-referral for recommendations regarding cardiomegaly seen on a chest x-ray.  Patient was seen by his primary care provider recently with complaints of fatigue.  Chest x-ray was obtained for which I do not have the report.  Apparently there was a notation regarding cardiomegaly in this report.  The patient is semiretired from [employer].  Last week he unfortunately suffered a fall and fractured his right ankle.  Prior to this he was doing everything that he needs to do in a day without exertional angina, exertional dyspnea, presyncope, or syncope.  He denies any palpitations or paroxysmal nocturnal dyspnea.  He does have episodes of bronchitis seasonally.  He is tolerating his Zocor without myalgias.  He has not required any emergency room visits or hospitalizations for cardiovascular complaints.   Current Medications: Current Meds  Medication Sig   aspirin EC 81 MG tablet Take 1 tablet (81 mg total) by mouth daily. Swallow whole.   fluticasone (VERAMYST) 27.5 MCG/SPRAY nasal spray Place 2 sprays into the nose daily.   Multiple Vitamin (MULTIVITAMIN) capsule Take 1 capsule by mouth daily.   simvastatin (ZOCOR) 40 MG tablet Take 40 mg by mouth daily.   [DISCONTINUED] tamsulosin (FLOMAX) 0.4 MG CAPS capsule Take 1 capsule (0.4 mg total) by mouth daily.     Allergies:  Patient has no known allergies.   Social History:   Social History   Tobacco Use   Smoking status: Never  Substance Use Topics   Alcohol use: Yes    Comment: 3 per month   Drug use: No     Family Hx: Family History  Problem Relation Age of Onset   Hypertension Father    Diabetes Father    Heart disease Father 59       1st MI - died at age 82   Pancreatic cancer Mother 17   Alzheimer's disease Maternal Grandmother    Diabetes Paternal Grandmother      Review of Systems:   Please see the history of present illness.    All  other systems reviewed and are negative.     EKGs/Labs/Other Test Reviewed:    EKG:  EKG performed 2016 that I personally reviewed demonstrates sinus rhythm with anterior infarction and inferior infarction pattern; EKG performed today that I personally reviewed demonstrates sinus rhythm with anterior and inferior infarction pattern.  Prior CV studies:  Exercise echocardiogram stress test 2016: Preserved ejection fraction with no ischemia noted  Other studies Reviewed: Review of the additional studies/records demonstrates: CT abdomen 2019 with aortic atherosclerosis  Recent Labs: No results found for requested labs within last 365 days.   Recent Lipid Panel No results found for: "CHOL", "TRIG", "HDL", "LDLCALC", "LDLDIRECT"  Risk Assessment/Calculations:           Physical Exam:    VS:  BP (!) 146/72   Pulse 70   Ht 6' (1.829 m)   Wt 205 lb (93 kg)   SpO2 97%   BMI 27.80 kg/m    Wt Readings from Last 3 Encounters:  08/20/21 205 lb (93 kg)  04/27/17 203 lb (92.1 kg)  03/22/15 203 lb (92.1 kg)   GENERAL:  No apparent distress, AOx3 HEENT:  No carotid bruits, +2 carotid impulses, no scleral icterus CAR: RRR no murmurs, gallops, rubs, or thrills RES:  Clear to auscultation bilaterally ABD:  Soft, nontender, nondistended, positive bowel sounds x 4 VASC:  +2 radial pulses, +2 carotid pulses, palpable pedal pulses NEURO:  CN 2-12 grossly intact; motor and sensory grossly intact PSYCH:  No active depression or anxiety EXT: Right ankle in boot; without edema  Signed, Early Osmond, MD  08/20/2021 4:17 PM    [address], [place], [place]  [address] Phone: [phone]; Fax: [phone]   Note:  This document was prepared using Dragon voice recognition software and may include unintentional dictation errors.

## 2021-08-20 ENCOUNTER — Encounter: Payer: Self-pay | Admitting: Internal Medicine

## 2021-08-20 ENCOUNTER — Ambulatory Visit (INDEPENDENT_AMBULATORY_CARE_PROVIDER_SITE_OTHER): Payer: Medicare Other | Admitting: Internal Medicine

## 2021-08-20 VITALS — BP 146/72 | HR 70 | Ht 72.0 in | Wt 205.0 lb

## 2021-08-20 DIAGNOSIS — I700 Atherosclerosis of aorta: Secondary | ICD-10-CM

## 2021-08-20 DIAGNOSIS — E785 Hyperlipidemia, unspecified: Secondary | ICD-10-CM | POA: Diagnosis not present

## 2021-08-20 DIAGNOSIS — I517 Cardiomegaly: Secondary | ICD-10-CM | POA: Diagnosis not present

## 2021-08-20 MED ORDER — ASPIRIN 81 MG PO TBEC: 81.0000 mg | DELAYED_RELEASE_TABLET | Freq: Every day | ORAL | 3 refills | Status: AC

## 2021-08-20 NOTE — Patient Instructions (Signed)
Medication Instructions:  Your physician has recommended you make the following change in your medication:  1.) start aspirin 81 mg - one tablet daily  *If you need a refill on your cardiac medications before your next appointment, please call your pharmacy*   Lab Work: Today: lipids/Lp(a)  If you have labs (blood work) drawn today and your tests are completely normal, you will receive your results only by: [facility] (if you have MyChart) OR A paper copy in the mail If you have any lab test that is abnormal or we need to change your treatment, we will call you to review the results.   Testing/Procedures: Your physician has requested that you have an echocardiogram. Echocardiography is a painless test that uses sound waves to create images of your heart. It provides your doctor with information about the size and shape of your heart and how well your heart's chambers and valves are working. This procedure takes approximately one hour. There are no restrictions for this procedure.   Follow-Up: At [facility], you and your health needs are our priority.  As part of our continuing mission to provide you with exceptional heart care, we have created designated Provider Care Teams.  These Care Teams include your primary Cardiologist (physician) and Advanced Practice Providers (APPs -  Physician Assistants and Nurse Practitioners) who all work together to provide you with the care you need, when you need it.  Your next appointment:   2 month(s)  The format for your next appointment:   In Person  Provider:   Early Osmond, MD     Important Information About Sugar

## 2021-08-21 LAB — LIPOPROTEIN A (LPA): Lipoprotein (a): 9.8 nmol/L (ref <75.0)

## 2021-08-21 LAB — LIPID PANEL
Chol/HDL Ratio: 4.3 ratio (ref 0.0–5.0)
Cholesterol, Total: 163 mg/dL (ref 100–199)
HDL: 38 mg/dL — ABNORMAL LOW (ref >39)
LDL Chol Calc (NIH): 101 mg/dL — ABNORMAL HIGH (ref 0–99)
Triglycerides: 135 mg/dL (ref 0–149)
VLDL Cholesterol Cal: 24 mg/dL (ref 5–40)

## 2021-09-03 ENCOUNTER — Ambulatory Visit (HOSPITAL_COMMUNITY): Payer: Medicare Other | Attending: Cardiology

## 2021-09-03 DIAGNOSIS — E785 Hyperlipidemia, unspecified: Secondary | ICD-10-CM | POA: Diagnosis present

## 2021-09-03 DIAGNOSIS — I517 Cardiomegaly: Secondary | ICD-10-CM | POA: Diagnosis present

## 2021-09-03 DIAGNOSIS — I700 Atherosclerosis of aorta: Secondary | ICD-10-CM

## 2021-09-03 LAB — ECHOCARDIOGRAM COMPLETE
Area-P 1/2: 2.82 cm2
S' Lateral: 3.5 cm

## 2021-10-20 NOTE — Progress Notes (Unsigned)
Cardiology Office Note:    Date:  10/21/2021   ID:  Jason Crane, DOB 22-Apr-1955, MRN [id number]  PCP:  Jefm Petty, MD   [facility] Providers Cardiologist:  Lenna Sciara, MD Referring MD: Jefm Petty, MD   Chief Complaint/Reason for Referral: Cardiomegaly  ASSESSMENT:    1. Nonspecific abnormal electrocardiogram (ECG) (EKG)   2. Hyperlipidemia LDL goal <70   3. Aortic atherosclerosis ([place])   4. Diastolic dysfunction      PLAN:    In order of problems listed above: 1.  Abnormal EKG: The patient has inferior Q waves and anterior Q waves.  He has no wall motion abnormalities on echocardiogram and his ejection fraction is preserved.  I am not sure what to make of this finding.  I am wondering whether this represents small vessel disease or prior nontransmural myocardial infarction.  Given left ventricular hypertrophy we will start losartan 25 mg daily and check a BMP in 1 week's time.  We will refer for cardiac MRI given abnormal Q waves and left ventricular hypertrophy.  Follow-up in 9 months or earlier if needed. 2.  Hyperlipidemia: LP(a) not elevated.  His LDL is above goal of 70 given the presence of aortic atherosclerosis.  We will change to atorvastatin '40mg'$  and check lipid panel and LFTs in 2 months. 3.  Aortic atherosclerosis: Continue statin with goal LDL of less than 70, aspirin, and strict blood pressure control. 4.  Diastolic dysfunction: Start losartan as above.  If becomes symptomatic could consider SGLT2 inhibitor.  Given LVH and abnormal EKG we will refer for cardiac MRI to evaluate for infiltrative cardiomyopathy.     Dispo:  Return in about 9 months (around 07/22/2022).      Medication Adjustments/Labs and Tests Ordered: Current medicines are reviewed at length with the patient today.  Concerns regarding medicines are outlined above.  The following changes have been made:     Labs/tests ordered: Orders Placed This Encounter  Procedures   MR CARDIAC  MORPHOLOGY W WO CONTRAST   Lipid panel   Hepatic function panel   Basic metabolic panel   CBC    Medication Changes: Meds ordered this encounter  Medications   DISCONTD: losartan (COZAAR) 25 MG tablet    Sig: Take 1 tablet (25 mg total) by mouth daily.    Dispense:  90 tablet    Refill:  3   DISCONTD: atorvastatin (LIPITOR) 40 MG tablet    Sig: Take 1 tablet (40 mg total) by mouth daily.    Dispense:  90 tablet    Refill:  3   atorvastatin (LIPITOR) 40 MG tablet    Sig: Take 1 tablet (40 mg total) by mouth daily.    Dispense:  90 tablet    Refill:  3   losartan (COZAAR) 25 MG tablet    Sig: Take 1 tablet (25 mg total) by mouth daily.    Dispense:  90 tablet    Refill:  3     Current medicines are reviewed at length with the patient today.  The patient does not have concerns regarding medicines.   History of Present Illness:    FOCUSED PROBLEM LIST:   1.  Hyperlipidemia; LP(a) 9.8 2.  Aortic atherosclerosis on CT scan 2019  July 2023: Patient seen for initial consultation as a self-referral due to cardiomegaly seen on chest x-ray.  It was noted that his EKG demonstrated anterior and inferior Q waves.  He was referred for an echocardiogram which demonstrated preserved ejection  fraction with no wall motion abnormalities and left ventricular hypertrophy.    Today: The patient continues to do fairly well.  He is recuperating from his left ankle being broken.  He is out of his cast.  He is walking without any issues.  He denies any significant shortness of breath or chest discomfort.  He has had no signs or symptoms of peripheral edema.  He is required no emergency room visits or hospitalizations.   Current Medications: Current Meds  Medication Sig   aspirin EC 81 MG tablet Take 1 tablet (81 mg total) by mouth daily. Swallow whole.   fluticasone (VERAMYST) 27.5 MCG/SPRAY nasal spray Place 2 sprays into the nose daily.   Multiple Vitamin (MULTIVITAMIN) capsule Take 1 capsule  by mouth daily.   [DISCONTINUED] atorvastatin (LIPITOR) 40 MG tablet Take 1 tablet (40 mg total) by mouth daily.   [DISCONTINUED] losartan (COZAAR) 25 MG tablet Take 1 tablet (25 mg total) by mouth daily.   [DISCONTINUED] simvastatin (ZOCOR) 20 MG tablet Take 20 mg by mouth at bedtime.   [DISCONTINUED] simvastatin (ZOCOR) 40 MG tablet Take 40 mg by mouth daily.     Allergies:    Patient has no known allergies.   Social History:   Social History   Tobacco Use   Smoking status: Never  Substance Use Topics   Alcohol use: Yes    Comment: 3 per month   Drug use: No     Family Hx: Family History  Problem Relation Age of Onset   Hypertension Father    Diabetes Father    Heart disease Father 30       1st MI - died at age 44   Pancreatic cancer Mother 52   Alzheimer's disease Maternal Grandmother    Diabetes Paternal Grandmother      Review of Systems:   Please see the history of present illness.    All other systems reviewed and are negative.     EKGs/Labs/Other Test Reviewed:    EKG:  EKG performed 2016 that I personally reviewed demonstrates sinus rhythm with anterior infarction and inferior infarction pattern; EKG performed today that I personally reviewed demonstrates sinus rhythm with anterior and inferior infarction pattern.  Prior CV studies:  TTE 2023: 1. Left ventricular ejection fraction by 3D volume is 62 %. The left  ventricle has normal function. The left ventricle has no regional wall  motion abnormalities. There is mild left ventricular hypertrophy. Left  ventricular diastolic parameters are  consistent with Grade I diastolic dysfunction (impaired relaxation).   2. Right ventricular systolic function is normal. The right ventricular  size is normal. There is normal pulmonary artery systolic pressure.   3. The mitral valve is normal in structure. No evidence of mitral valve  regurgitation. No evidence of mitral stenosis.   4. The aortic valve is normal in  structure. Aortic valve regurgitation is  not visualized. No aortic stenosis is present.   5. The inferior vena cava is normal in size with greater than 50%  respiratory variability, suggesting right atrial pressure of 3 mmHg.  Exercise echocardiogram stress test 2016: Preserved ejection fraction with no ischemia noted  Other studies Reviewed: Review of the additional studies/records demonstrates: CT abdomen 2019 with aortic atherosclerosis  Recent Labs: No results found for requested labs within last 365 days.   Recent Lipid Panel Lab Results  Component Value Date/Time   CHOL 163 08/20/2021 04:21 PM   TRIG 135 08/20/2021 04:21 PM   HDL 38 (L)  08/20/2021 04:21 PM   LDLCALC 101 (H) 08/20/2021 04:21 PM    Risk Assessment/Calculations:           Physical Exam:    VS:  BP 118/72   Pulse 67   Ht 6' (1.829 m)   Wt 213 lb 9.6 oz (96.9 kg)   SpO2 97%   BMI 28.97 kg/m    Wt Readings from Last 3 Encounters:  10/21/21 213 lb 9.6 oz (96.9 kg)  08/20/21 205 lb (93 kg)  04/27/17 203 lb (92.1 kg)   GENERAL:  No apparent distress, AOx3 HEENT:  No carotid bruits, +2 carotid impulses, no scleral icterus CAR: RRR no murmurs, gallops, rubs, or thrills RES:  Clear to auscultation bilaterally ABD:  Soft, nontender, nondistended, positive bowel sounds x 4 VASC:  +2 radial pulses, +2 carotid pulses, palpable pedal pulses NEURO:  CN 2-12 grossly intact; motor and sensory grossly intact PSYCH:  No active depression or anxiety EXT: Right ankle in boot; without edema  Signed, Early Osmond, MD  10/21/2021 11:36 AM    [facility] [address], [place],   [address] Phone: [phone]; Fax: [phone]   Note:  This document was prepared using Dragon voice recognition software and may include unintentional dictation errors.

## 2021-10-21 ENCOUNTER — Encounter: Payer: Self-pay | Admitting: Internal Medicine

## 2021-10-21 ENCOUNTER — Ambulatory Visit: Payer: Medicare Other | Attending: Internal Medicine | Admitting: Internal Medicine

## 2021-10-21 VITALS — BP 118/72 | HR 67 | Ht 72.0 in | Wt 213.6 lb

## 2021-10-21 DIAGNOSIS — I5189 Other ill-defined heart diseases: Secondary | ICD-10-CM | POA: Diagnosis present

## 2021-10-21 DIAGNOSIS — I700 Atherosclerosis of aorta: Secondary | ICD-10-CM | POA: Insufficient documentation

## 2021-10-21 DIAGNOSIS — R9431 Abnormal electrocardiogram [ECG] [EKG]: Secondary | ICD-10-CM | POA: Insufficient documentation

## 2021-10-21 DIAGNOSIS — E785 Hyperlipidemia, unspecified: Secondary | ICD-10-CM | POA: Insufficient documentation

## 2021-10-21 MED ORDER — LOSARTAN POTASSIUM 25 MG PO TABS: 25.0000 mg | ORAL_TABLET | Freq: Every day | ORAL | 3 refills | Status: DC

## 2021-10-21 MED ORDER — ATORVASTATIN CALCIUM 40 MG PO TABS: 40.0000 mg | ORAL_TABLET | Freq: Every day | ORAL | 3 refills | Status: DC

## 2021-10-21 NOTE — Patient Instructions (Addendum)
Medication Instructions:  Your physician has recommended you make the following change in your medication:  1) STOP taking Zocor (simvastatin) 2) START taking Lipitor (atorvastatin) 40 mg daily  3) START taking losartan 25 mg daily  *If you need a refill on your cardiac medications before your next appointment, please call your pharmacy*  Lab Work: IN ONE WEEK: BMET and CBC IN TWO MONTHS: Fasting lipids and LFTs  If you have labs (blood work) drawn today and your tests are completely normal, you will receive your results only by: [place] (if you have MyChart) OR A paper copy in the mail If you have any lab test that is abnormal or we need to change your treatment, we will call you to review the results.  Testing/Procedures: Your physician has requested that you have a cardiac MRI. Cardiac MRI uses a computer to create images of your heart as its beating, producing both still and moving pictures of your heart and major blood vessels. For further information please visit [email or web address] Please follow the instruction sheet given to you today for more information.  Follow-Up: At [facility], you and your health needs are our priority.  As part of our continuing mission to provide you with exceptional heart care, we have created designated Provider Care Teams.  These Care Teams include your primary Cardiologist (physician) and Advanced Practice Providers (APPs -  Physician Assistants and Nurse Practitioners) who all work together to provide you with the care you need, when you need it.  Your next appointment:   9 month(s)  The format for your next appointment:   In Person  Provider:   Early Osmond, MD      Important Information About Sugar

## 2021-11-05 ENCOUNTER — Ambulatory Visit: Payer: Medicare Other | Attending: Internal Medicine

## 2021-11-05 DIAGNOSIS — R9431 Abnormal electrocardiogram [ECG] [EKG]: Secondary | ICD-10-CM

## 2021-11-05 DIAGNOSIS — I700 Atherosclerosis of aorta: Secondary | ICD-10-CM

## 2021-11-05 DIAGNOSIS — I5189 Other ill-defined heart diseases: Secondary | ICD-10-CM

## 2021-11-05 DIAGNOSIS — E785 Hyperlipidemia, unspecified: Secondary | ICD-10-CM

## 2021-11-05 LAB — BASIC METABOLIC PANEL
BUN/Creatinine Ratio: 19 (ref 10–24)
BUN: 17 mg/dL (ref 8–27)
CO2: 26 mmol/L (ref 20–29)
Calcium: 9 mg/dL (ref 8.6–10.2)
Chloride: 101 mmol/L (ref 96–106)
Creatinine, Ser: 0.91 mg/dL (ref 0.76–1.27)
Glucose: 124 mg/dL — ABNORMAL HIGH (ref 70–99)
Potassium: 4 mmol/L (ref 3.5–5.2)
Sodium: 139 mmol/L (ref 134–144)
eGFR: 94 mL/min/{1.73_m2} (ref >59)

## 2021-11-05 LAB — CBC
Hematocrit: 42.5 % (ref 37.5–51.0)
Hemoglobin: 14.4 g/dL (ref 13.0–17.7)
MCH: 30.1 pg (ref 26.6–33.0)
MCHC: 33.9 g/dL (ref 31.5–35.7)
MCV: 89 fL (ref 79–97)
Platelets: 198 10*3/uL (ref 150–450)
RBC: 4.79 x10E6/uL (ref 4.14–5.80)
RDW: 12.5 % (ref 11.6–15.4)
WBC: 5.8 10*3/uL (ref 3.4–10.8)

## 2021-12-21 ENCOUNTER — Ambulatory Visit: Payer: Medicare Other | Attending: Internal Medicine

## 2021-12-21 DIAGNOSIS — I700 Atherosclerosis of aorta: Secondary | ICD-10-CM

## 2021-12-21 DIAGNOSIS — R9431 Abnormal electrocardiogram [ECG] [EKG]: Secondary | ICD-10-CM

## 2021-12-21 DIAGNOSIS — I5189 Other ill-defined heart diseases: Secondary | ICD-10-CM

## 2021-12-21 DIAGNOSIS — E785 Hyperlipidemia, unspecified: Secondary | ICD-10-CM

## 2021-12-21 LAB — LIPID PANEL
Chol/HDL Ratio: 3.3 ratio (ref 0.0–5.0)
Cholesterol, Total: 101 mg/dL (ref 100–199)
HDL: 31 mg/dL — ABNORMAL LOW (ref >39)
LDL Chol Calc (NIH): 52 mg/dL (ref 0–99)
Triglycerides: 88 mg/dL (ref 0–149)
VLDL Cholesterol Cal: 18 mg/dL (ref 5–40)

## 2021-12-21 LAB — HEPATIC FUNCTION PANEL
ALT: 26 IU/L (ref 0–44)
AST: 23 IU/L (ref 0–40)
Albumin: 4.3 g/dL (ref 3.9–4.9)
Alkaline Phosphatase: 76 IU/L (ref 44–121)
Bilirubin Total: 0.4 mg/dL (ref 0.0–1.2)
Bilirubin, Direct: 0.16 mg/dL (ref 0.00–0.40)
Total Protein: 6.7 g/dL (ref 6.0–8.5)

## 2022-01-12 ENCOUNTER — Telehealth (HOSPITAL_COMMUNITY): Payer: Self-pay | Admitting: Emergency Medicine

## 2022-01-12 NOTE — Telephone Encounter (Signed)
Reaching out to patient to offer assistance regarding upcoming cardiac imaging study; pt verbalizes understanding of appt date/time, parking situation and where to check in, pre-test NPO status and medications ordered, and verified current allergies; name and call back number provided for further questions should they arise Marchia Bond RN Navigator Cardiac Imaging [facility] [phone] office [phone] cell  Arrival 730 Screws / plate L ankle Denies iv issues Denies claustro

## 2022-01-13 ENCOUNTER — Ambulatory Visit (HOSPITAL_COMMUNITY)
Admission: RE | Admit: 2022-01-13 | Discharge: 2022-01-13 | Disposition: A | Discharge status: Home / Self Care | Payer: Medicare Other | Source: Ambulatory Visit | Attending: Internal Medicine | Admitting: Internal Medicine

## 2022-01-13 ENCOUNTER — Other Ambulatory Visit: Payer: Self-pay | Admitting: Internal Medicine

## 2022-01-13 DIAGNOSIS — I700 Atherosclerosis of aorta: Secondary | ICD-10-CM

## 2022-01-13 DIAGNOSIS — E785 Hyperlipidemia, unspecified: Secondary | ICD-10-CM

## 2022-01-13 DIAGNOSIS — R9431 Abnormal electrocardiogram [ECG] [EKG]: Secondary | ICD-10-CM

## 2022-01-13 DIAGNOSIS — I5189 Other ill-defined heart diseases: Secondary | ICD-10-CM

## 2022-01-13 MED ORDER — GADOBUTROL 1 MMOL/ML IV SOLN
10.0000 mL | Freq: Once | INTRAVENOUS | Status: AC | PRN
  Administered 2022-01-13: 7.5 mL via INTRAVENOUS

## 2022-02-04 ENCOUNTER — Encounter: Payer: Self-pay | Admitting: Internal Medicine

## 2022-02-04 NOTE — Telephone Encounter (Signed)
Left message to call office

## 2022-02-04 NOTE — Telephone Encounter (Signed)
Patient is returning call.  °

## 2022-02-04 NOTE — Telephone Encounter (Signed)
I spoke with patient.  He reports for the last 10 days or so he has noticed a dull, nagging spasm type feeling under his left collarbone.  Lasts about 4-5 seconds but he will have several of these spasms over about a 10 minute period.  Does not seem to be related to activity or exertion.  Does have some dizziness when bending over and then standing up.  This is new after starting BP medication.  Orthostatic precautions reviewed with patient.  Patient reports recent cardiac findings are all new for him and he is asking if Dr Ali Lowe feels a Cardiac CT is indicated.

## 2022-04-02 ENCOUNTER — Other Ambulatory Visit: Payer: Self-pay

## 2022-04-02 MED ORDER — LOSARTAN POTASSIUM 25 MG PO TABS: 25.0000 mg | ORAL_TABLET | Freq: Every day | ORAL | 2 refills | Status: DC

## 2022-04-02 MED ORDER — ATORVASTATIN CALCIUM 40 MG PO TABS: 40.0000 mg | ORAL_TABLET | Freq: Every day | ORAL | 2 refills | Status: DC

## 2022-04-02 NOTE — Addendum Note (Signed)
Addended by: Carter Kitten D on: 04/02/2022 09:35 AM   Modules accepted: Orders

## 2022-11-11 NOTE — Progress Notes (Addendum)
 Cardiology Office Note:   Date:  11/12/2022  ID:  Jason Crane, DOB February 20, 1955, MRN [id number] PCP:  Loyal Jacobson, MD  [facility] Providers Cardiologist:  Alverda Skeans, MD Referring MD: Loyal Jacobson, MD   Chief Complaint/Reason for Referral: Cardiology follow-up ASSESSMENT:    1. LVH (left ventricular hypertrophy)   2. Hyperlipidemia LDL goal <70   3. Aortic atherosclerosis ([facility])   4. BMI 29.0-29.9,adult   5. Prediabetes   6. Elevated blood pressure reading without diagnosis of hypertension     PLAN:   In order of problems listed above: 1.  LVH: CMR was reassuring.  Continue losartan.  Consider SGLT2 inhibitor in the future particularly depending on diabetes status.  Currently the patient is a prediabetic. 2.  Hyperlipidemia: Check lipid panel and LFTs today. 3.  Aortic atherosclerosis: Continue aspirin, atorvastatin, and strict blood pressure control. 4.  Elevated BMI:  If he develops a formal diagnosis of diabetes I would consider referral to pharmacy regarding GLP-1 receptor agonist therapy. 5.  Prediabetes: Continue losartan, aspirin, and statin. 6.  Elevated blood pressure reading: Will have the patient return with his blood pressure monitor next week for blood pressure check.  Depending on results we may need to increase his losartan to 50 mg.          Dispo:  Return in about 6 months (around 05/13/2023).    Medication Adjustments/Labs and Tests Ordered: Current medicines are reviewed at length with the patient today.  Concerns regarding medicines are outlined above.  The following changes have been made:  no change   Labs/tests ordered: Orders Placed This Encounter  Procedures   Lipid Profile   Hepatic function panel   EKG 12-Lead    Medication Changes: No orders of the defined types were placed in this encounter.   Current medicines are reviewed at length with the patient today.  The patient does not have concerns regarding medicines.  History of  Present Illness:      FOCUSED PROBLEM LIST:   Hyperlipidemia; LP(a) 9.8 Aortic atherosclerosis on CT scan 2019 Anterior and inferior Q waves on EKG; normal cardiac MR 2023 BMI 29 Prediabetes Celiac disease    July 2023: Patient seen for initial consultation as a self-referral due to cardiomegaly seen on chest x-ray.  It was noted that his EKG demonstrated anterior and inferior Q waves.  He was referred for an echocardiogram which demonstrated preserved ejection fraction with no wall motion abnormalities and left ventricular hypertrophy.     September 2023: The patient continues to do fairly well.  He is recuperating from his left ankle being broken.  He is out of his cast.  He is walking without any issues.  He denies any significant shortness of breath or chest discomfort.  He has had no signs or symptoms of peripheral edema.  He is required no emergency room visits or hospitalizations.  Plan due to LVH start losartan 25 mg daily, given abnormal Q waves on EKG obtain cardiac MRI, increase atorvastatin to 40 mg.  October 2024: In the interim he had a cardiac MRI which showed no abnormalities.  His lipid panel in February 2024 showed an LDL of 68.  He is doing quite well.  He got bored of being retired and so he is working part-time.  His blood pressures are well-controlled at home per his readings on his phone with systolic blood pressures typically less than 130 mmHg and diastolic pressures that are very well-controlled.  He has been compliant with his  medical therapy.  He has not had any issues with chest pain, shortness of breath, presyncope or syncope.  He has had no issues with his atorvastatin such as myalgias or arthralgias.  He is otherwise well and without complaints today.     Current Medications: Current Meds  Medication Sig   aspirin EC 81 MG tablet Take 1 tablet (81 mg total) by mouth daily. Swallow whole.   atorvastatin (LIPITOR) 40 MG tablet Take 1 tablet (40 mg total) by mouth  daily.   Cholecalciferol 125 MCG (5000 UT) TABS Take 5,000 Units by mouth daily.   Cyanocobalamin 500 MCG SUBL Place under the tongue.   fluticasone (VERAMYST) 27.5 MCG/SPRAY nasal spray Place 2 sprays into the nose daily.   folic acid (FOLVITE) 400 MCG tablet Take 400 mcg by mouth daily.   losartan (COZAAR) 25 MG tablet Take 1 tablet (25 mg total) by mouth daily.   Multiple Vitamin (MULTIVITAMIN) capsule Take 1 capsule by mouth daily.      Review of Systems:   Please see the history of present illness.    All other systems reviewed and are negative.      EKGs/Labs/Other Test Reviewed:   EKG:   EKG Interpretation Date/Time:  Friday November 12 2022 15:23:13 EDT Ventricular Rate:  78 PR Interval:  152 QRS Duration:  72 QT Interval:  334 QTC Calculation: 380 R Axis:   -3  Text Interpretation: Normal sinus rhythm Inferior infarct , age undetermined Anteroseptal infarct , age undetermined No previous ECGs available Confirmed by Alverda Skeans ([id number]) on 11/12/2022 3:31:47 PM         Risk Assessment/Calculations:          Physical Exam:   VS:  BP (!) 140/85   Pulse 78   Ht 6' (1.829 m)   Wt 214 lb (97.1 kg)   BMI 29.02 kg/m    HYPERTENSION CONTROL Vitals:   11/12/22 1520 11/12/22 1548  BP: (!) 140/90 (!) 140/85    The patient's blood pressure is elevated above target today.  In order to address the patient's elevated BP: A return visit for a blood pressure check with the RN or CMA will be arranged.      Wt Readings from Last 3 Encounters:  11/12/22 214 lb (97.1 kg)  10/21/21 213 lb 9.6 oz (96.9 kg)  08/20/21 205 lb (93 kg)      GENERAL:  No apparent distress, AOx3 HEENT:  No carotid bruits, +2 carotid impulses, no scleral icterus CAR: RRR no murmurs, gallops, rubs, or thrills RES:  Clear to auscultation bilaterally ABD:  Soft, nontender, nondistended, positive bowel sounds x 4 VASC:  +2 radial pulses, +2 carotid pulses NEURO:  CN 2-12 grossly intact; motor  and sensory grossly intact PSYCH:  No active depression or anxiety EXT:  No edema, ecchymosis, or cyanosis  Signed, Orbie Pyo, MD  11/12/2022 4:10 PM    [facility] [address] [place], [place], [place]  [address] Phone: [phone]; Fax: [phone]   Note:  This document was prepared using Dragon voice recognition software and may include unintentional dictation errors.

## 2022-11-12 ENCOUNTER — Ambulatory Visit: Payer: Medicare Other | Attending: Internal Medicine | Admitting: Internal Medicine

## 2022-11-12 ENCOUNTER — Encounter: Payer: Self-pay | Admitting: Internal Medicine

## 2022-11-12 VITALS — BP 140/85 | HR 78 | Ht 72.0 in | Wt 214.0 lb

## 2022-11-12 DIAGNOSIS — R7303 Prediabetes: Secondary | ICD-10-CM | POA: Diagnosis present

## 2022-11-12 DIAGNOSIS — Z6829 Body mass index (BMI) 29.0-29.9, adult: Secondary | ICD-10-CM | POA: Insufficient documentation

## 2022-11-12 DIAGNOSIS — I700 Atherosclerosis of aorta: Secondary | ICD-10-CM | POA: Insufficient documentation

## 2022-11-12 DIAGNOSIS — E785 Hyperlipidemia, unspecified: Secondary | ICD-10-CM | POA: Diagnosis not present

## 2022-11-12 DIAGNOSIS — R030 Elevated blood-pressure reading, without diagnosis of hypertension: Secondary | ICD-10-CM | POA: Insufficient documentation

## 2022-11-12 DIAGNOSIS — I517 Cardiomegaly: Secondary | ICD-10-CM | POA: Diagnosis present

## 2022-11-12 NOTE — Patient Instructions (Addendum)
Medication Instructions:   Your physician recommends that you continue on your current medications as directed. Please refer to the Current Medication list given to you today.  *If you need a refill on your cardiac medications before your next appointment, please call your pharmacy*   Lab Work:  TODAY--LIPIDS AND LFTs  If you have labs (blood work) drawn today and your tests are completely normal, you will receive your results only by: MyChart Message (if you have MyChart) OR A paper copy in the mail If you have any lab test that is abnormal or we need to change your treatment, we will call you to review the results.    Follow-Up:  1.) ONE WEEK HERE IN THE OFFICE FOR A NURSE VISIT (BLOOD PRESSURE CHECK) ON THURSDAY 11/18/22 --PLEASE BRING YOUR BLOOD PRESSURE CUFF TO THIS APPOINTMENT   2.)  6 MONTHS WITH DR. Lynnette Caffey

## 2022-11-13 LAB — HEPATIC FUNCTION PANEL
ALT: 24 [IU]/L (ref 0–44)
AST: 29 [IU]/L (ref 0–40)
Albumin: 4.4 g/dL (ref 3.9–4.9)
Alkaline Phosphatase: 58 [IU]/L (ref 44–121)
Bilirubin Total: 0.9 mg/dL (ref 0.0–1.2)
Bilirubin, Direct: 0.29 mg/dL (ref 0.00–0.40)
Total Protein: 6.7 g/dL (ref 6.0–8.5)

## 2022-11-13 LAB — LIPID PANEL
Chol/HDL Ratio: 2.7 {ratio} (ref 0.0–5.0)
Cholesterol, Total: 89 mg/dL — ABNORMAL LOW (ref 100–199)
HDL: 33 mg/dL — ABNORMAL LOW (ref >39)
LDL Chol Calc (NIH): 40 mg/dL (ref 0–99)
Triglycerides: 74 mg/dL (ref 0–149)
VLDL Cholesterol Cal: 16 mg/dL (ref 5–40)

## 2022-11-18 ENCOUNTER — Ambulatory Visit: Payer: Medicare Other | Attending: Internal Medicine | Admitting: *Deleted

## 2022-11-18 VITALS — BP 146/74 | HR 70 | Wt 218.2 lb

## 2022-11-18 DIAGNOSIS — Z0130 Encounter for examination of blood pressure without abnormal findings: Secondary | ICD-10-CM | POA: Insufficient documentation

## 2022-11-18 NOTE — Progress Notes (Signed)
   Nurse Visit   Date of Encounter: 11/18/2022 ID: Jason Crane, DOB [birth year], MRN [id number]  PCP:  Loyal Jacobson, MD   [facility] Providers Cardiologist:  Orbie Pyo, MD      Visit Details   VS:  BP (!) 146/74 (BP Location: Left Arm, Patient Position: Sitting, Cuff Size: Normal) Comment: home BP cuff  Pulse 70   Wt 218 lb 3.2 oz (99 kg)   BMI 29.59 kg/m  , BMI Body mass index is 29.59 kg/m.  Wt Readings from Last 3 Encounters:  11/18/22 218 lb 3.2 oz (99 kg)  11/12/22 214 lb (97.1 kg)  10/21/21 213 lb 9.6 oz (96.9 kg)     Reason for visit: BP  CHECK Performed today: Vitals, EKG, Provider consulted:Dr. Tenny Craw, and Education Changes (medications, testing, etc.) : NONE Length of Visit: 15 minutes  BP at last visit 140/85.  Pts home readings show lower.  120-130s/70s.  Only home reading since visit last week was this morning and it was elevated today: 150/80. His home cuff today was similar to manual check.  Manual 142/74, auto 146/74.  I asked him to continue to monitor in the mornings for now.  He takes losartan at bedtime.  Adv to report if BP readings at home are consistently greater than 135/85.  Pt voices understanding and agreement w this.     Medications Adjustments/Labs and Tests Ordered: No orders of the defined types were placed in this encounter.  No orders of the defined types were placed in this encounter.    Jerald Kief, RN  11/18/2022 3:22 PM

## 2023-01-14 ENCOUNTER — Other Ambulatory Visit: Payer: Self-pay | Admitting: Internal Medicine

## 2023-04-25 NOTE — Progress Notes (Signed)
 Cardiology Office Note:   Date:  05/05/2023  ID:  Jason Crane, DOB [birth year], MRN [id number] PCP:  Loyal Jacobson, MD  [facility] Providers Cardiologist:  Alverda Skeans, MD Referring MD: Loyal Jacobson, MD  Chief Complaint/Reason for Referral: Follow-up for hyperlipidemia and aortic atherosclerosis ASSESSMENT:    1. LVH (left ventricular hypertrophy)   2. Hyperlipidemia LDL goal <70   3. Aortic atherosclerosis (HCC)   4. BMI 29.0-29.9,adult   5. Prediabetes   6. Primary hypertension   7. Diastolic dysfunction     PLAN:   In order of problems listed above: LVH: Reassuring CMR.  Continue aggressive blood pressure control.  Continue losartan 25 mg daily and start Jardiance 10 mg daily. Hyperlipidemia: LDL was at goal in October.  Continue atorvastatin 40 mg. Aortic atherosclerosis: Continue aspirin 81 mg and atorvastatin 40 mg. Elevated BMI: Hemoglobin A1c of 5.7 in February.  Continue diet and exercise modification. Prediabetes: Continue aspirin 81 mg, losartan 25 mg, diet and exercise optimization. Hypertension: Increase losartan to 50 mg.  The patient will coronary stress with blood pressures in the coming weeks.  If still remains above goal of 130/80 will increase losartan to 75 mg or consider change to Micardis. Diastolic dysfunction: Continue losartan 25 mg daily and start Jardiance 10 mg daily.            Dispo:  Return in about 1 year (around 05/04/2024).      Medication Adjustments/Labs and Tests Ordered: Current medicines are reviewed at length with the patient today.  Concerns regarding medicines are outlined above.  The following changes have been made:     Labs/tests ordered: No orders of the defined types were placed in this encounter.   Medication Changes: Meds ordered this encounter  Medications   losartan (COZAAR) 50 MG tablet    Sig: Take 1 tablet (50 mg total) by mouth daily.    Dispense:  90 tablet    Refill:  3    Dose change (increased on  05/05/2023).   empagliflozin (JARDIANCE) 10 MG TABS tablet    Sig: Take 1 tablet (10 mg total) by mouth daily before breakfast.    Dispense:  90 tablet    Refill:  3   empagliflozin (JARDIANCE) 10 MG TABS tablet    Sig: Take 1 tablet (10 mg total) by mouth daily before breakfast.    Dispense:  28 tablet    Refill:  0    Lot Number?:   [id number]    Expiration Date?:   02/07/2025    Current medicines are reviewed at length with the patient today.  The patient does not have concerns regarding medicines.  I spent 35 minutes reviewing all clinical data during and prior to this visit including all relevant imaging studies, laboratories, clinical information from other health systems and prior notes from both Cardiology and other specialties, interviewing the patient, conducting a complete physical examination, and coordinating care in order to formulate a comprehensive and personalized evaluation and treatment plan.   History of Present Illness:      FOCUSED PROBLEM LIST:   Diastolic dysfunction Mild LVH, no significant valvular abnormalities, EF 62% TTE 2023 CMR without LGE or infiltrative disease, EF 60% 2023 Hyperlipidemia LP(a) 9.8 Aortic atherosclerosis CT 2019 Anterior and inferior Q waves CMR without LGE 2023 Prediabetes Celiac disease BMI 05 September 2021: Patient seen for initial consultation as a self-referral due to cardiomegaly seen on chest x-ray.  It was noted that his EKG demonstrated anterior and inferior  Q waves.  He was referred for an echocardiogram which demonstrated preserved ejection fraction with no wall motion abnormalities and left ventricular hypertrophy.     September 2023: The patient continues to do fairly well.  He is recuperating from his left ankle being broken.  He is out of his cast.  He is walking without any issues.  He denies any significant shortness of breath or chest discomfort.  He has had no signs or symptoms of peripheral edema.  He is required no  emergency room visits or hospitalizations.  Plan due to LVH start losartan 25 mg daily, given abnormal Q waves on EKG obtain cardiac MRI, increase atorvastatin to 40 mg.   October 2024: In the interim he had a cardiac MRI which showed no abnormalities.  His lipid panel in February 2024 showed an LDL of 68.  He is doing quite well.  He got bored of being retired and so he is working part-time.  His blood pressures are well-controlled at home per his readings on his phone with systolic blood pressures typically less than 130 mmHg and diastolic pressures that are very well-controlled.  He has been compliant with his medical therapy.  He has not had any issues with chest pain, shortness of breath, presyncope or syncope.  He has had no issues with his atorvastatin such as myalgias or arthralgias.  He is otherwise well and without complaints today.  Plan: Check lipid panel and LFTs.  March 2025:   In the interim the patient had nurse blood pressure check which demonstrated a blood pressure of 146/74.  No changes were made to his medical regimen.  His LDL was 40.  The patient is doing very well.  Denies any chest pain or shortness of breath.  His blood pressures are marginally elevated at home with some blood pressure readings in the 130s and some diastolic blood pressures above 80.  He has had no issues with his medications.  He has had no need for emergency room evaluations or hospitalizations.  He is tolerating his medications well.  He may be retiring in the next few months but he is not sure.  He is otherwise well and without significant complaints today.            Current Medications: Current Meds  Medication Sig   aspirin EC 81 MG tablet Take 1 tablet (81 mg total) by mouth daily. Swallow whole.   atorvastatin (LIPITOR) 40 MG tablet TAKE 1 TABLET DAILY   Cholecalciferol 125 MCG (5000 UT) TABS Take 5,000 Units by mouth daily.   Cyanocobalamin 500 MCG SUBL Place under the tongue.   empagliflozin  (JARDIANCE) 10 MG TABS tablet Take 1 tablet (10 mg total) by mouth daily before breakfast.   empagliflozin (JARDIANCE) 10 MG TABS tablet Take 1 tablet (10 mg total) by mouth daily before breakfast.   famotidine (PEPCID) 40 MG tablet Take 40 mg by mouth daily.   ferrous sulfate 325 (65 FE) MG tablet Take by mouth.   fluticasone (VERAMYST) 27.5 MCG/SPRAY nasal spray Place 2 sprays into the nose daily.   folic acid (FOLVITE) 400 MCG tablet Take 400 mcg by mouth daily.   losartan (COZAAR) 50 MG tablet Take 1 tablet (50 mg total) by mouth daily.   Multiple Vitamin (MULTIVITAMIN) capsule Take 1 capsule by mouth daily.   tamsulosin (FLOMAX) 0.4 MG CAPS capsule Take 0.4 mg by mouth daily.   testosterone cypionate (DEPOTESTOTERONE CYPIONATE) 100 MG/ML injection Inject 100 mg into the muscle every  7 (seven) days.   [DISCONTINUED] losartan (COZAAR) 25 MG tablet TAKE 1 TABLET DAILY     Review of Systems:   Please see the history of present illness.    All other systems reviewed and are negative.     EKGs/Labs/Other Test Reviewed:   EKG: EKG from October 2024 demonstrates sinus rhythm with anterior and inferior infarction patterns  EKG Interpretation Date/Time:    Ventricular Rate:    PR Interval:    QRS Duration:    QT Interval:    QTC Calculation:   R Axis:      Text Interpretation:           Risk Assessment/Calculations:          Physical Exam:   VS:  BP 135/82   Pulse 72   Ht 6' (1.829 m)   Wt 214 lb (97.1 kg)   SpO2 98%   BMI 29.02 kg/m        Wt Readings from Last 3 Encounters:  05/05/23 214 lb (97.1 kg)  11/18/22 218 lb 3.2 oz (99 kg)  11/12/22 214 lb (97.1 kg)      GENERAL:  No apparent distress, AOx3 HEENT:  No carotid bruits, +2 carotid impulses, no scleral icterus CAR: RRR  no murmurs, gallops, rubs, or thrills RES:  Clear to auscultation bilaterally ABD:  Soft, nontender, nondistended, positive bowel sounds x 4 VASC:  +2 radial pulses, +2 carotid  pulses NEURO:  CN 2-12 grossly intact; motor and sensory grossly intact PSYCH:  No active depression or anxiety EXT:  No edema, ecchymosis, or cyanosis  Signed, Orbie Pyo, MD  05/05/2023 8:34 AM    [facility] [address] [place], [place], [place]  [address] Phone: [phone]; Fax: [phone]   Note:  This document was prepared using Dragon voice recognition software and may include unintentional dictation errors.

## 2023-05-05 ENCOUNTER — Ambulatory Visit: Payer: Medicare Other | Attending: Internal Medicine | Admitting: Internal Medicine

## 2023-05-05 ENCOUNTER — Encounter: Payer: Self-pay | Admitting: Internal Medicine

## 2023-05-05 VITALS — BP 135/82 | HR 72 | Ht 72.0 in | Wt 214.0 lb

## 2023-05-05 DIAGNOSIS — Z6829 Body mass index (BMI) 29.0-29.9, adult: Secondary | ICD-10-CM | POA: Insufficient documentation

## 2023-05-05 DIAGNOSIS — I517 Cardiomegaly: Secondary | ICD-10-CM | POA: Insufficient documentation

## 2023-05-05 DIAGNOSIS — I5189 Other ill-defined heart diseases: Secondary | ICD-10-CM | POA: Insufficient documentation

## 2023-05-05 DIAGNOSIS — R7303 Prediabetes: Secondary | ICD-10-CM | POA: Diagnosis present

## 2023-05-05 DIAGNOSIS — I700 Atherosclerosis of aorta: Secondary | ICD-10-CM | POA: Diagnosis present

## 2023-05-05 DIAGNOSIS — I10 Essential (primary) hypertension: Secondary | ICD-10-CM | POA: Insufficient documentation

## 2023-05-05 DIAGNOSIS — E785 Hyperlipidemia, unspecified: Secondary | ICD-10-CM | POA: Insufficient documentation

## 2023-05-05 MED ORDER — LOSARTAN POTASSIUM 50 MG PO TABS: 50.0000 mg | ORAL_TABLET | Freq: Every day | ORAL | 3 refills | Status: AC

## 2023-05-05 MED ORDER — EMPAGLIFLOZIN 10 MG PO TABS
10.0000 mg | ORAL_TABLET | Freq: Every day | ORAL | 0 refills | Status: AC
Start: 2023-05-05 — End: ?

## 2023-05-05 MED ORDER — EMPAGLIFLOZIN 10 MG PO TABS: 10.0000 mg | ORAL_TABLET | Freq: Every day | ORAL | 3 refills | Status: AC

## 2023-05-05 NOTE — Patient Instructions (Addendum)
 Medication Instructions:  Your physician has recommended you make the following change in your medication:   1) INCREASE losartan to 50 mg daily 2) START empagliflozin (Jardiance) 10 mg daily before breakfast  *If you need a refill on your cardiac medications before your next appointment, please call your pharmacy*  Follow-Up: At [facility], you and your health needs are our priority.  As part of our continuing mission to provide you with exceptional heart care, we have created designated Provider Care Teams.  These Care Teams include your primary Cardiologist (physician) and Advanced Practice Providers (APPs -  Physician Assistants and Nurse Practitioners) who all work together to provide you with the care you need, when you need it.  Your next appointment:   1 year(s)  The format for your next appointment:   In Person  Provider:   Orbie Pyo, MD {  Other Instructions   1st Floor: - Lobby - Registration  - Pharmacy  - Lab - Cafe  2nd Floor: - PV Lab - Diagnostic Testing (echo, CT, nuclear med)  3rd Floor: - Vacant  4th Floor: - TCTS (cardiothoracic surgery) - AFib Clinic - Structural Heart Clinic - Vascular Surgery  - Vascular Ultrasound  5th Floor: - HeartCare Cardiology (general and EP) - Clinical Pharmacy for coumadin, hypertension, lipid, weight-loss medications, and med management appointments    Valet parking services will be available as well.

## 2024-01-01 ENCOUNTER — Other Ambulatory Visit: Payer: Self-pay | Admitting: Internal Medicine
